# Patient Record
Sex: Male | Born: 1980 | Race: White | Hispanic: No | Marital: Single | Smoking: Former smoker
Health system: Southern US, Community
[De-identification: ages and names within clinical notes are randomized; demographics above are authoritative.]

## PROBLEM LIST (undated history)

## (undated) DIAGNOSIS — I10 Essential (primary) hypertension: Secondary | ICD-10-CM

## (undated) DIAGNOSIS — Z87898 Personal history of other specified conditions: Secondary | ICD-10-CM

## (undated) HISTORY — DX: Personal history of other specified conditions: Z87.898

## (undated) HISTORY — DX: Essential (primary) hypertension: I10

---

## 2013-04-30 DIAGNOSIS — Z302 Encounter for sterilization: Secondary | ICD-10-CM | POA: Insufficient documentation

## 2015-06-21 ENCOUNTER — Inpatient Hospital Stay: Payer: BLUE CROSS/BLUE SHIELD | Attending: Oncology | Admitting: Oncology

## 2015-06-21 ENCOUNTER — Encounter: Payer: Self-pay | Admitting: Oncology

## 2015-06-21 DIAGNOSIS — Z87891 Personal history of nicotine dependence: Secondary | ICD-10-CM | POA: Insufficient documentation

## 2015-06-21 DIAGNOSIS — R748 Abnormal levels of other serum enzymes: Secondary | ICD-10-CM | POA: Insufficient documentation

## 2015-06-21 DIAGNOSIS — Z79899 Other long term (current) drug therapy: Secondary | ICD-10-CM | POA: Insufficient documentation

## 2015-06-21 DIAGNOSIS — I10 Essential (primary) hypertension: Secondary | ICD-10-CM | POA: Insufficient documentation

## 2015-06-24 ENCOUNTER — Inpatient Hospital Stay: Payer: BLUE CROSS/BLUE SHIELD

## 2015-06-24 LAB — CBC WITH DIFFERENTIAL/PLATELET
Basophils Absolute: 0.1 10*3/uL (ref 0–0.1)
Basophils Relative: 1 %
EOS ABS: 0.1 10*3/uL (ref 0–0.7)
Eosinophils Relative: 1 %
HCT: 45 % (ref 40.0–52.0)
Hemoglobin: 15.8 g/dL (ref 13.0–18.0)
LYMPHS ABS: 2.6 10*3/uL (ref 1.0–3.6)
Lymphocytes Relative: 28 %
MCH: 32.7 pg (ref 26.0–34.0)
MCHC: 35 g/dL (ref 32.0–36.0)
MCV: 93.4 fL (ref 80.0–100.0)
MONO ABS: 0.6 10*3/uL (ref 0.2–1.0)
MONOS PCT: 6 %
Neutro Abs: 6 10*3/uL (ref 1.4–6.5)
Neutrophils Relative %: 64 %
Platelets: 251 10*3/uL (ref 150–440)
RBC: 4.82 MIL/uL (ref 4.40–5.90)
RDW: 12.3 % (ref 11.5–14.5)
WBC: 9.3 10*3/uL (ref 3.8–10.6)

## 2015-06-24 LAB — FERRITIN: Ferritin: 251 ng/mL (ref 24–336)

## 2015-06-24 LAB — IRON AND TIBC
IRON: 110 ug/dL (ref 45–182)
Saturation Ratios: 27 % (ref 17.9–39.5)
TIBC: 412 ug/dL (ref 250–450)
UIBC: 302 ug/dL

## 2015-07-03 NOTE — Progress Notes (Signed)
[facility]  Telephone:(336) [phone] Fax:(336) [phone]  ID: Lum Babe OB: 1981/06/14  MR#: [id number]  AOZ#:[id number]  Patient Care Team: Jerl Mina, MD as PCP - General (Family Medicine)  CHIEF COMPLAINT:  Chief Complaint  Patient presents with  . Chemotherapy    Hemochromatosis    INTERVAL HISTORY: Patient is a 34 year old male who was recently found to have homozygous for hemochromatosis. He also has mildly increased iron stores as well as liver enzymes. He currently feels well and is asymptomatic. He has no neurologic complaints. He denies any recent fevers or illnesses. He has a good appetite and denies weight loss. He denies any chest pain or shortness of breath. He has no abdominal pain and denies any nausea, vomiting, constipation, or diarrhea. He has no urinary complaints. Patient feels at his baseline and offers no specific complaints today.  REVIEW OF SYSTEMS:   Review of Systems  Constitutional: Negative.   Gastrointestinal: Negative.   Musculoskeletal: Negative.     As per HPI. Otherwise, a complete review of systems is negatve.  PAST MEDICAL HISTORY: Past Medical History  Diagnosis Date  . Hypertension   . History of seizure     PAST SURGICAL HISTORY: No past surgical history on file.  FAMILY HISTORY No family history on file.     ADVANCED DIRECTIVES:    HEALTH MAINTENANCE: History  Substance Use Topics  . Smoking status: Former Games developer  . Smokeless tobacco: Not on file  . Alcohol Use: Yes     Comment: occasional     Colonoscopy:  PAP:  Bone density:  Lipid panel:  No Known Allergies  Current Outpatient Prescriptions  Medication Sig Dispense Refill  . lisinopril (PRINIVIL,ZESTRIL) 10 MG tablet   10   No current facility-administered medications for this visit.    OBJECTIVE: Filed Vitals:   06/21/15 1518  BP: 138/92  Pulse: 71  Temp: 97.1 F (36.2 C)  Resp: 16     There is no height on file to  calculate BMI.    ECOG FS:0 - Asymptomatic  General: Well-developed, well-nourished, no acute distress. Eyes: Pink conjunctiva, anicteric sclera. HEENT: Normocephalic, moist mucous membranes, clear oropharnyx. Lungs: Clear to auscultation bilaterally. Heart: Regular rate and rhythm. No rubs, murmurs, or gallops. Abdomen: Soft, nontender, nondistended. No organomegaly noted, normoactive bowel sounds. Musculoskeletal: No edema, cyanosis, or clubbing. Neuro: Alert, answering all questions appropriately. Cranial nerves grossly intact. Skin: No rashes or petechiae noted. Psych: Normal affect. Lymphatics: No cervical, calvicular, axillary or inguinal LAD.   LAB RESULTS:  No results found for: NA, K, CL, CO2, GLUCOSE, BUN, CREATININE, CALCIUM, PROT, ALBUMIN, AST, ALT, ALKPHOS, BILITOT, GFRNONAA, GFRAA  Lab Results  Component Value Date   WBC 9.3 06/24/2015   NEUTROABS 6.0 06/24/2015   HGB 15.8 06/24/2015   HCT 45.0 06/24/2015   MCV 93.4 06/24/2015   PLT 251 06/24/2015     STUDIES: No results found.  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  PLAN:    1. Hereditary hemachromatosis: Patient iron stores are within normal limits, but he was still benefit from phlebotomy with a goal ferritin of approximately 50-100. Currently, his ferritin is 251. Return to clinic once a month with laboratory work and consideration of phlebotomy. Patient will then return to clinic in 4 months for further evaluation. 2. Elevated liver enzymes: Unclear etiology. Possibly but unlikely related to hemochromatosis, phlebotomy as above. Monitor.  Patient expressed understanding and was in agreement with this plan.  He also understands that He can call clinic at any time with any questions, concerns, or complaints.    Jeralyn Ruths, MD   07/03/2015 4:29 PM

## 2015-07-22 ENCOUNTER — Inpatient Hospital Stay: Payer: BLUE CROSS/BLUE SHIELD

## 2015-07-22 ENCOUNTER — Inpatient Hospital Stay: Payer: BLUE CROSS/BLUE SHIELD | Attending: Oncology

## 2015-07-22 DIAGNOSIS — Z79899 Other long term (current) drug therapy: Secondary | ICD-10-CM | POA: Insufficient documentation

## 2015-07-28 ENCOUNTER — Inpatient Hospital Stay: Payer: BLUE CROSS/BLUE SHIELD

## 2015-07-28 DIAGNOSIS — Z79899 Other long term (current) drug therapy: Secondary | ICD-10-CM | POA: Diagnosis not present

## 2015-07-28 LAB — CBC WITH DIFFERENTIAL/PLATELET
BASOS ABS: 0 10*3/uL (ref 0–0.1)
BASOS PCT: 0 %
EOS ABS: 0.1 10*3/uL (ref 0–0.7)
EOS PCT: 1 %
HCT: 42.7 % (ref 40.0–52.0)
Hemoglobin: 15.1 g/dL (ref 13.0–18.0)
LYMPHS ABS: 2.5 10*3/uL (ref 1.0–3.6)
Lymphocytes Relative: 27 %
MCH: 32.7 pg (ref 26.0–34.0)
MCHC: 35.4 g/dL (ref 32.0–36.0)
MCV: 92.6 fL (ref 80.0–100.0)
Monocytes Absolute: 0.7 10*3/uL (ref 0.2–1.0)
Monocytes Relative: 8 %
NEUTROS PCT: 64 %
Neutro Abs: 6 10*3/uL (ref 1.4–6.5)
PLATELETS: 228 10*3/uL (ref 150–440)
RBC: 4.61 MIL/uL (ref 4.40–5.90)
RDW: 12.4 % (ref 11.5–14.5)
WBC: 9.3 10*3/uL (ref 3.8–10.6)

## 2015-07-28 LAB — FERRITIN: FERRITIN: 145 ng/mL (ref 24–336)

## 2015-08-19 ENCOUNTER — Inpatient Hospital Stay: Payer: BLUE CROSS/BLUE SHIELD | Attending: Oncology

## 2015-08-19 ENCOUNTER — Inpatient Hospital Stay: Payer: BLUE CROSS/BLUE SHIELD

## 2015-08-19 LAB — CBC WITH DIFFERENTIAL/PLATELET
BASOS PCT: 1 %
Basophils Absolute: 0.1 10*3/uL (ref 0–0.1)
EOS PCT: 1 %
Eosinophils Absolute: 0.1 10*3/uL (ref 0–0.7)
HEMATOCRIT: 43.3 % (ref 40.0–52.0)
Hemoglobin: 15.1 g/dL (ref 13.0–18.0)
LYMPHS PCT: 30 %
Lymphs Abs: 2.3 10*3/uL (ref 1.0–3.6)
MCH: 32.3 pg (ref 26.0–34.0)
MCHC: 34.8 g/dL (ref 32.0–36.0)
MCV: 92.8 fL (ref 80.0–100.0)
MONO ABS: 0.5 10*3/uL (ref 0.2–1.0)
MONOS PCT: 6 %
NEUTROS ABS: 4.7 10*3/uL (ref 1.4–6.5)
Neutrophils Relative %: 62 %
PLATELETS: 243 10*3/uL (ref 150–440)
RBC: 4.67 MIL/uL (ref 4.40–5.90)
RDW: 12.3 % (ref 11.5–14.5)
WBC: 7.6 10*3/uL (ref 3.8–10.6)

## 2015-08-19 LAB — FERRITIN: Ferritin: 114 ng/mL (ref 24–336)

## 2015-08-23 ENCOUNTER — Encounter (INDEPENDENT_AMBULATORY_CARE_PROVIDER_SITE_OTHER): Payer: Self-pay

## 2015-08-23 ENCOUNTER — Inpatient Hospital Stay: Payer: BLUE CROSS/BLUE SHIELD

## 2015-09-16 ENCOUNTER — Inpatient Hospital Stay: Payer: BLUE CROSS/BLUE SHIELD | Attending: Oncology

## 2015-09-16 ENCOUNTER — Inpatient Hospital Stay: Payer: BLUE CROSS/BLUE SHIELD

## 2015-09-16 LAB — CBC WITH DIFFERENTIAL/PLATELET
BASOS ABS: 0 10*3/uL (ref 0–0.1)
BASOS PCT: 1 %
Eosinophils Absolute: 0.1 10*3/uL (ref 0–0.7)
Eosinophils Relative: 1 %
HEMATOCRIT: 44.7 % (ref 40.0–52.0)
HEMOGLOBIN: 15.7 g/dL (ref 13.0–18.0)
LYMPHS PCT: 34 %
Lymphs Abs: 2.5 10*3/uL (ref 1.0–3.6)
MCH: 32.5 pg (ref 26.0–34.0)
MCHC: 35 g/dL (ref 32.0–36.0)
MCV: 92.7 fL (ref 80.0–100.0)
MONO ABS: 0.5 10*3/uL (ref 0.2–1.0)
MONOS PCT: 6 %
NEUTROS ABS: 4.2 10*3/uL (ref 1.4–6.5)
NEUTROS PCT: 58 %
Platelets: 232 10*3/uL (ref 150–440)
RBC: 4.82 MIL/uL (ref 4.40–5.90)
RDW: 12.2 % (ref 11.5–14.5)
WBC: 7.3 10*3/uL (ref 3.8–10.6)

## 2015-09-16 LAB — FERRITIN: Ferritin: 89 ng/mL (ref 24–336)

## 2015-09-17 ENCOUNTER — Telehealth: Payer: Self-pay | Admitting: *Deleted

## 2015-09-17 NOTE — Telephone Encounter (Signed)
Call placed to patient to give lab results, ferritin 89. Patient does not need phlebotomy, patient to follow up in 1 month at previously scheduled appointment.

## 2015-09-24 ENCOUNTER — Encounter: Payer: Self-pay | Admitting: Oncology

## 2015-10-14 ENCOUNTER — Inpatient Hospital Stay: Payer: BLUE CROSS/BLUE SHIELD

## 2015-10-14 ENCOUNTER — Inpatient Hospital Stay: Payer: BLUE CROSS/BLUE SHIELD | Attending: Oncology | Admitting: Oncology

## 2015-10-14 DIAGNOSIS — R748 Abnormal levels of other serum enzymes: Secondary | ICD-10-CM | POA: Diagnosis not present

## 2015-10-14 DIAGNOSIS — I10 Essential (primary) hypertension: Secondary | ICD-10-CM | POA: Diagnosis not present

## 2015-10-14 DIAGNOSIS — Z79899 Other long term (current) drug therapy: Secondary | ICD-10-CM | POA: Diagnosis not present

## 2015-10-14 DIAGNOSIS — Z87891 Personal history of nicotine dependence: Secondary | ICD-10-CM | POA: Diagnosis not present

## 2015-10-14 LAB — CBC WITH DIFFERENTIAL/PLATELET
BASOS PCT: 1 %
Basophils Absolute: 0 10*3/uL (ref 0–0.1)
EOS PCT: 1 %
Eosinophils Absolute: 0.1 10*3/uL (ref 0–0.7)
HEMATOCRIT: 46.1 % (ref 40.0–52.0)
Hemoglobin: 16 g/dL (ref 13.0–18.0)
LYMPHS PCT: 33 %
Lymphs Abs: 2.5 10*3/uL (ref 1.0–3.6)
MCH: 31.6 pg (ref 26.0–34.0)
MCHC: 34.7 g/dL (ref 32.0–36.0)
MCV: 91.2 fL (ref 80.0–100.0)
MONO ABS: 0.4 10*3/uL (ref 0.2–1.0)
MONOS PCT: 6 %
NEUTROS ABS: 4.6 10*3/uL (ref 1.4–6.5)
Neutrophils Relative %: 59 %
PLATELETS: 244 10*3/uL (ref 150–440)
RBC: 5.05 MIL/uL (ref 4.40–5.90)
RDW: 12 % (ref 11.5–14.5)
WBC: 7.7 10*3/uL (ref 3.8–10.6)

## 2015-10-14 LAB — FERRITIN: Ferritin: 69 ng/mL (ref 24–336)

## 2015-10-31 NOTE — Progress Notes (Signed)
[facility]  Telephone:(336) [phone] Fax:(336) [phone]  ID: Francis Morrison OB: 04-26-1981  MR#: [id number]  GEX#:[id number]#:[id number]  Patient Care Team: Jerl MinaJames Hedrick, MD as PCP - General (Family Medicine)  CHIEF COMPLAINT: Homozygous for hemachromatosis. No chief complaint on file.   INTERVAL HISTORY: Patient returns to clinic today for repeat laboratory work, further evaluation, and continuation of phlebotomy. He continues to feel well and is asymptomatic. He has no neurologic complaints. He denies any recent fevers or illnesses. He has a good appetite and denies weight loss. He denies any chest pain or shortness of breath. He has no abdominal pain and denies any nausea, vomiting, constipation, or diarrhea. He has no urinary complaints. Patient offers no specific complaints today.  REVIEW OF SYSTEMS:   Review of Systems  Constitutional: Negative.   Respiratory: Negative.   Cardiovascular: Negative.   Gastrointestinal: Negative.   Musculoskeletal: Negative.   Neurological: Negative.     As per HPI. Otherwise, a complete review of systems is negatve.  PAST MEDICAL HISTORY: Past Medical History  Diagnosis Date  . Hypertension   . History of seizure     PAST SURGICAL HISTORY: No past surgical history on file.  FAMILY HISTORY: Reviewed and unchanged. No reported history of malignancy or chronic disease. No known history of hemochromatosis.     ADVANCED DIRECTIVES:    HEALTH MAINTENANCE: Social History  Substance Use Topics  . Smoking status: Former Games developermoker  . Smokeless tobacco: Not on file  . Alcohol Use: Yes     Comment: occasional     Colonoscopy:  PAP:  Bone density:  Lipid panel:  No Known Allergies  Current Outpatient Prescriptions  Medication Sig Dispense Refill  . lisinopril (PRINIVIL,ZESTRIL) 10 MG tablet   10   No current facility-administered medications for this visit.    OBJECTIVE: Filed Vitals:   10/14/15 1443  BP: 132/79    Pulse: 89  Temp: 98.6 F (37 C)  Resp: 16     There is no height on file to calculate BMI.    ECOG FS:0 - Asymptomatic  General: Well-developed, well-nourished, no acute distress. Eyes: Pink conjunctiva, anicteric sclera. Lungs: Clear to auscultation bilaterally. Heart: Regular rate and rhythm. No rubs, murmurs, or gallops. Abdomen: Soft, nontender, nondistended. No organomegaly noted, normoactive bowel sounds. Musculoskeletal: No edema, cyanosis, or clubbing. Neuro: Alert, answering all questions appropriately. Cranial nerves grossly intact. Skin: No rashes or petechiae noted. Psych: Normal affect.   LAB RESULTS:  No results found for: NA, K, CL, CO2, GLUCOSE, BUN, CREATININE, CALCIUM, PROT, ALBUMIN, AST, ALT, ALKPHOS, BILITOT, GFRNONAA, GFRAA  Lab Results  Component Value Date   WBC 7.7 10/14/2015   NEUTROABS 4.6 10/14/2015   HGB 16.0 10/14/2015   HCT 46.1 10/14/2015   MCV 91.2 10/14/2015   PLT 244 10/14/2015     STUDIES: No results found.  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  PLAN:    1. Hereditary hemachromatosis: Patient iron stores are within normal limits, but he will still benefit from phlebotomy with a goal ferritin of approximately 50-100. Currently, his ferritin is 69. Return to clinic every 6 weeks with laboratory work and consideration of phlebotomy. Patient will then return to clinic in 6 months for further evaluation. 2. Elevated liver enzymes: Unclear etiology. Possibly but unlikely related to hemochromatosis, phlebotomy as above. Monitor.  Patient expressed understanding and was in agreement with this plan. He also understands that He can call clinic at any time with  any questions, concerns, or complaints.    Jeralyn Ruths, MD   10/31/2015 10:45 PM

## 2015-11-25 ENCOUNTER — Inpatient Hospital Stay: Payer: BLUE CROSS/BLUE SHIELD

## 2015-11-25 ENCOUNTER — Inpatient Hospital Stay: Payer: BLUE CROSS/BLUE SHIELD | Attending: Oncology

## 2015-11-25 DIAGNOSIS — Z87891 Personal history of nicotine dependence: Secondary | ICD-10-CM | POA: Insufficient documentation

## 2015-11-25 DIAGNOSIS — R748 Abnormal levels of other serum enzymes: Secondary | ICD-10-CM | POA: Insufficient documentation

## 2015-11-25 DIAGNOSIS — Z79899 Other long term (current) drug therapy: Secondary | ICD-10-CM | POA: Insufficient documentation

## 2015-11-25 LAB — CBC WITH DIFFERENTIAL/PLATELET
Basophils Absolute: 0.1 10*3/uL (ref 0–0.1)
Basophils Relative: 1 %
EOS PCT: 1 %
Eosinophils Absolute: 0.1 10*3/uL (ref 0–0.7)
HEMATOCRIT: 45.3 % (ref 40.0–52.0)
Hemoglobin: 15.7 g/dL (ref 13.0–18.0)
LYMPHS ABS: 3 10*3/uL (ref 1.0–3.6)
LYMPHS PCT: 30 %
MCH: 31.3 pg (ref 26.0–34.0)
MCHC: 34.6 g/dL (ref 32.0–36.0)
MCV: 90.6 fL (ref 80.0–100.0)
MONO ABS: 0.6 10*3/uL (ref 0.2–1.0)
MONOS PCT: 7 %
NEUTROS ABS: 6 10*3/uL (ref 1.4–6.5)
Neutrophils Relative %: 61 %
PLATELETS: 254 10*3/uL (ref 150–440)
RBC: 5 MIL/uL (ref 4.40–5.90)
RDW: 12.5 % (ref 11.5–14.5)
WBC: 9.9 10*3/uL (ref 3.8–10.6)

## 2015-11-25 LAB — FERRITIN: Ferritin: 90 ng/mL (ref 24–336)

## 2016-01-06 ENCOUNTER — Inpatient Hospital Stay: Payer: BLUE CROSS/BLUE SHIELD | Attending: Oncology

## 2016-01-06 ENCOUNTER — Inpatient Hospital Stay: Payer: BLUE CROSS/BLUE SHIELD

## 2016-01-06 LAB — CBC WITH DIFFERENTIAL/PLATELET
BASOS ABS: 0.1 10*3/uL (ref 0–0.1)
Basophils Relative: 1 %
EOS PCT: 1 %
Eosinophils Absolute: 0.1 10*3/uL (ref 0–0.7)
HEMATOCRIT: 48 % (ref 40.0–52.0)
Hemoglobin: 16.8 g/dL (ref 13.0–18.0)
LYMPHS ABS: 3 10*3/uL (ref 1.0–3.6)
LYMPHS PCT: 33 %
MCH: 32.4 pg (ref 26.0–34.0)
MCHC: 35.1 g/dL (ref 32.0–36.0)
MCV: 92.2 fL (ref 80.0–100.0)
MONO ABS: 0.6 10*3/uL (ref 0.2–1.0)
MONOS PCT: 6 %
NEUTROS ABS: 5.4 10*3/uL (ref 1.4–6.5)
Neutrophils Relative %: 59 %
PLATELETS: 210 10*3/uL (ref 150–440)
RBC: 5.2 MIL/uL (ref 4.40–5.90)
RDW: 13.4 % (ref 11.5–14.5)
WBC: 9.1 10*3/uL (ref 3.8–10.6)

## 2016-01-06 LAB — FERRITIN: Ferritin: 82 ng/mL (ref 24–336)

## 2016-02-17 ENCOUNTER — Inpatient Hospital Stay: Payer: BLUE CROSS/BLUE SHIELD | Attending: Oncology

## 2016-02-17 ENCOUNTER — Inpatient Hospital Stay: Payer: BLUE CROSS/BLUE SHIELD

## 2016-02-17 LAB — CBC WITH DIFFERENTIAL/PLATELET
BASOS ABS: 0 10*3/uL (ref 0–0.1)
EOS ABS: 0.1 10*3/uL (ref 0–0.7)
HCT: 45.2 % (ref 40.0–52.0)
HEMOGLOBIN: 16.3 g/dL (ref 13.0–18.0)
Lymphocytes Relative: 37 %
Lymphs Abs: 2.5 10*3/uL (ref 1.0–3.6)
MCH: 33.3 pg (ref 26.0–34.0)
MCHC: 36.1 g/dL — ABNORMAL HIGH (ref 32.0–36.0)
MCV: 92.3 fL (ref 80.0–100.0)
Monocytes Absolute: 0.5 10*3/uL (ref 0.2–1.0)
Monocytes Relative: 7 %
Neutro Abs: 3.7 10*3/uL (ref 1.4–6.5)
PLATELETS: 204 10*3/uL (ref 150–440)
RBC: 4.89 MIL/uL (ref 4.40–5.90)
RDW: 12.5 % (ref 11.5–14.5)
WBC: 6.9 10*3/uL (ref 3.8–10.6)

## 2016-02-17 LAB — FERRITIN: FERRITIN: 81 ng/mL (ref 24–336)

## 2016-03-30 ENCOUNTER — Inpatient Hospital Stay: Payer: BLUE CROSS/BLUE SHIELD

## 2016-03-30 ENCOUNTER — Inpatient Hospital Stay: Payer: BLUE CROSS/BLUE SHIELD | Attending: Oncology

## 2016-03-30 LAB — CBC WITH DIFFERENTIAL/PLATELET
BASOS ABS: 0 10*3/uL (ref 0–0.1)
EOS ABS: 0 10*3/uL (ref 0–0.7)
HCT: 43.8 % (ref 40.0–52.0)
Hemoglobin: 15.7 g/dL (ref 13.0–18.0)
Lymphs Abs: 2.1 10*3/uL (ref 1.0–3.6)
MCH: 32.8 pg (ref 26.0–34.0)
MCHC: 35.8 g/dL (ref 32.0–36.0)
MCV: 91.6 fL (ref 80.0–100.0)
MONO ABS: 0.6 10*3/uL (ref 0.2–1.0)
Monocytes Relative: 7 %
Neutro Abs: 6.5 10*3/uL (ref 1.4–6.5)
Neutrophils Relative %: 71 %
PLATELETS: 246 10*3/uL (ref 150–440)
RBC: 4.79 MIL/uL (ref 4.40–5.90)
RDW: 12.5 % (ref 11.5–14.5)
WBC: 9.2 10*3/uL (ref 3.8–10.6)

## 2016-03-30 LAB — FERRITIN: Ferritin: 119 ng/mL (ref 24–336)

## 2016-04-20 ENCOUNTER — Other Ambulatory Visit: Payer: BLUE CROSS/BLUE SHIELD

## 2016-04-20 ENCOUNTER — Ambulatory Visit: Payer: BLUE CROSS/BLUE SHIELD | Admitting: Oncology

## 2016-04-27 ENCOUNTER — Inpatient Hospital Stay: Payer: BLUE CROSS/BLUE SHIELD | Admitting: Oncology

## 2016-04-27 ENCOUNTER — Inpatient Hospital Stay: Payer: BLUE CROSS/BLUE SHIELD

## 2016-05-15 ENCOUNTER — Inpatient Hospital Stay (HOSPITAL_BASED_OUTPATIENT_CLINIC_OR_DEPARTMENT_OTHER): Payer: BLUE CROSS/BLUE SHIELD | Admitting: Oncology

## 2016-05-15 ENCOUNTER — Inpatient Hospital Stay: Payer: BLUE CROSS/BLUE SHIELD | Attending: Oncology

## 2016-05-15 ENCOUNTER — Inpatient Hospital Stay: Payer: BLUE CROSS/BLUE SHIELD

## 2016-05-15 DIAGNOSIS — Z79899 Other long term (current) drug therapy: Secondary | ICD-10-CM | POA: Insufficient documentation

## 2016-05-15 DIAGNOSIS — I10 Essential (primary) hypertension: Secondary | ICD-10-CM | POA: Insufficient documentation

## 2016-05-15 DIAGNOSIS — Z87891 Personal history of nicotine dependence: Secondary | ICD-10-CM | POA: Diagnosis not present

## 2016-05-15 LAB — CBC WITH DIFFERENTIAL/PLATELET
Basophils Absolute: 0 10*3/uL (ref 0–0.1)
Basophils Relative: 1 %
EOS ABS: 0 10*3/uL (ref 0–0.7)
Eosinophils Relative: 1 %
HEMATOCRIT: 44.3 % (ref 40.0–52.0)
HEMOGLOBIN: 15.7 g/dL (ref 13.0–18.0)
LYMPHS ABS: 1.9 10*3/uL (ref 1.0–3.6)
LYMPHS PCT: 24 %
MCH: 33 pg (ref 26.0–34.0)
MCHC: 35.5 g/dL (ref 32.0–36.0)
MCV: 93 fL (ref 80.0–100.0)
MONOS PCT: 7 %
Monocytes Absolute: 0.6 10*3/uL (ref 0.2–1.0)
NEUTROS ABS: 5.3 10*3/uL (ref 1.4–6.5)
NEUTROS PCT: 67 %
Platelets: 186 10*3/uL (ref 150–440)
RBC: 4.76 MIL/uL (ref 4.40–5.90)
RDW: 12.5 % (ref 11.5–14.5)
WBC: 7.8 10*3/uL (ref 3.8–10.6)

## 2016-05-15 LAB — FERRITIN: FERRITIN: 64 ng/mL (ref 24–336)

## 2016-05-21 NOTE — Progress Notes (Signed)
[facility]  Telephone:(336) [phone] Fax:(336) [phone]  ID: Francis BabePatrick J Morrison OB: 1981-10-23  MR#: [id number]  AOZ#:[id number]#:[id number]  Patient Care Team: Francis MinaJames Hedrick, MD as PCP - General (Family Medicine)  CHIEF COMPLAINT: Homozygous for hemachromatosis. Chief Complaint  Patient presents with  . Follow-up    hemochromotosis    INTERVAL HISTORY: Patient returns to clinic today for repeat laboratory work, further evaluation, and continuation of phlebotomy. He continues to feel well and is asymptomatic. He has no neurologic complaints. He denies any recent fevers or illnesses. He has a good appetite and denies weight loss. He denies any chest pain or shortness of breath. He has no abdominal pain and denies any nausea, vomiting, constipation, or diarrhea. He has no urinary complaints. Patient offers no specific complaints today.  REVIEW OF SYSTEMS:   Review of Systems  Constitutional: Negative.  Negative for fever, weight loss and malaise/fatigue.  Respiratory: Negative.  Negative for cough and shortness of breath.   Cardiovascular: Negative.  Negative for chest pain.  Gastrointestinal: Negative.  Negative for blood in stool and melena.  Genitourinary: Negative.   Musculoskeletal: Negative.   Neurological: Negative.  Negative for weakness.    As per HPI. Otherwise, a complete review of systems is negatve.  PAST MEDICAL HISTORY: Past Medical History  Diagnosis Date  . Hypertension   . History of seizure     PAST SURGICAL HISTORY: No past surgical history on file.  FAMILY HISTORY: Reviewed and unchanged. No reported history of malignancy or chronic disease. No known history of hemochromatosis.     ADVANCED DIRECTIVES:    HEALTH MAINTENANCE: Social History  Substance Use Topics  . Smoking status: Former Games developermoker  . Smokeless tobacco: Not on file  . Alcohol Use: Yes     Comment: occasional     Colonoscopy:  PAP:  Bone density:  Lipid panel:  No Known  Allergies  Current Outpatient Prescriptions  Medication Sig Dispense Refill  . ALPRAZolam (XANAX) 0.25 MG tablet   0  . clomiPHENE (CLOMID) 50 MG tablet   4  . escitalopram (LEXAPRO) 10 MG tablet Take by mouth.    Marland Kitchen. lisinopril (PRINIVIL,ZESTRIL) 10 MG tablet   10   No current facility-administered medications for this visit.    OBJECTIVE: Filed Vitals:   05/15/16 1018  BP: 126/88  Pulse: 72  Temp: 97.9 F (36.6 C)  Resp: 18     There is no height on file to calculate BMI.    ECOG FS:0 - Asymptomatic  General: Well-developed, well-nourished, no acute distress. Eyes: Pink conjunctiva, anicteric sclera. Lungs: Clear to auscultation bilaterally. Heart: Regular rate and rhythm. No rubs, murmurs, or gallops. Abdomen: Soft, nontender, nondistended. No organomegaly noted, normoactive bowel sounds. Musculoskeletal: No edema, cyanosis, or clubbing. Neuro: Alert, answering all questions appropriately. Cranial nerves grossly intact. Skin: No rashes or petechiae noted. Psych: Normal affect.   LAB RESULTS:  No results found for: NA, K, CL, CO2, GLUCOSE, BUN, CREATININE, CALCIUM, PROT, ALBUMIN, AST, ALT, ALKPHOS, BILITOT, GFRNONAA, GFRAA  Lab Results  Component Value Date   WBC 7.8 05/15/2016   NEUTROABS 5.3 05/15/2016   HGB 15.7 05/15/2016   HCT 44.3 05/15/2016   MCV 93.0 05/15/2016   PLT 186 05/15/2016   Lab Results  Component Value Date   FERRITIN 64 05/15/2016     STUDIES: No results found.  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  PLAN:    1. Hereditary hemachromatosis: Although patient's ferritin is  at goal of 64, his hemoglobin slightly increased at 15.7.  After discussion with the patient, he wishes to proceed with 400 mL phlebotomy today in order to maintain his ferritin between 50 and 100. Patient does not appear to need frequent phlebotomies, therefore will return to clinic in 6 months for laboratory  work, further evaluation, and consideration of additional phlebotomy.  2. History of elevated liver enzymes: Unclear etiology. Possibly but unlikely related to hemochromatosis, phlebotomy as above. Monitor.  Patient expressed understanding and was in agreement with this plan. He also understands that He can call clinic at any time with any questions, concerns, or complaints.    Jeralyn Ruths, MD   05/21/2016 11:01 PM

## 2016-10-20 ENCOUNTER — Emergency Department
Admission: EM | Admit: 2016-10-20 | Discharge: 2016-10-20 | Disposition: A | Discharge status: Home / Self Care | Payer: BLUE CROSS/BLUE SHIELD | Attending: Student in an Organized Health Care Education/Training Program | Admitting: Student in an Organized Health Care Education/Training Program

## 2016-10-20 ENCOUNTER — Encounter: Payer: Self-pay | Admitting: *Deleted

## 2016-10-20 ENCOUNTER — Emergency Department: Payer: BLUE CROSS/BLUE SHIELD

## 2016-10-20 DIAGNOSIS — Y999 Unspecified external cause status: Secondary | ICD-10-CM | POA: Diagnosis not present

## 2016-10-20 DIAGNOSIS — Y9389 Activity, other specified: Secondary | ICD-10-CM | POA: Diagnosis not present

## 2016-10-20 DIAGNOSIS — Z87891 Personal history of nicotine dependence: Secondary | ICD-10-CM | POA: Diagnosis not present

## 2016-10-20 DIAGNOSIS — Z79899 Other long term (current) drug therapy: Secondary | ICD-10-CM | POA: Insufficient documentation

## 2016-10-20 DIAGNOSIS — I10 Essential (primary) hypertension: Secondary | ICD-10-CM | POA: Insufficient documentation

## 2016-10-20 DIAGNOSIS — Z23 Encounter for immunization: Secondary | ICD-10-CM | POA: Diagnosis not present

## 2016-10-20 DIAGNOSIS — S60351A Superficial foreign body of right thumb, initial encounter: Secondary | ICD-10-CM | POA: Diagnosis not present

## 2016-10-20 DIAGNOSIS — Y929 Unspecified place or not applicable: Secondary | ICD-10-CM | POA: Insufficient documentation

## 2016-10-20 DIAGNOSIS — S6991XA Unspecified injury of right wrist, hand and finger(s), initial encounter: Secondary | ICD-10-CM

## 2016-10-20 DIAGNOSIS — W450XXA Nail entering through skin, initial encounter: Secondary | ICD-10-CM | POA: Diagnosis not present

## 2016-10-20 DIAGNOSIS — S66504A Unspecified injury of intrinsic muscle, fascia and tendon of right ring finger at wrist and hand level, initial encounter: Secondary | ICD-10-CM | POA: Diagnosis present

## 2016-10-20 MED ORDER — CEPHALEXIN 500 MG PO CAPS: 1000.0000 mg | ORAL_CAPSULE | Freq: Two times a day (BID) | ORAL | 0 refills | Status: AC

## 2016-10-20 MED ORDER — OXYCODONE-ACETAMINOPHEN 7.5-325 MG PO TABS: 1.0000 | ORAL_TABLET | ORAL | 0 refills | Status: AC | PRN

## 2016-10-20 MED ORDER — LIDOCAINE HCL (PF) 1 % IJ SOLN
INTRAMUSCULAR | Status: AC
  Administered 2016-10-20: 23:00:00
  Filled 2016-10-20: qty 5

## 2016-10-20 MED ORDER — OXYCODONE-ACETAMINOPHEN 5-325 MG PO TABS
1.0000 | ORAL_TABLET | Freq: Once | ORAL | Status: AC
  Administered 2016-10-20: 1 via ORAL
  Filled 2016-10-20: qty 1

## 2016-10-20 MED ORDER — SULFAMETHOXAZOLE-TRIMETHOPRIM 800-160 MG PO TABS: 1.0000 | ORAL_TABLET | Freq: Two times a day (BID) | ORAL | 0 refills | Status: AC

## 2016-10-20 MED ORDER — TETANUS-DIPHTH-ACELL PERTUSSIS 5-2.5-18.5 LF-MCG/0.5 IM SUSP
INTRAMUSCULAR | Status: AC
  Administered 2016-10-20: 0.5 mL via INTRAMUSCULAR
  Filled 2016-10-20: qty 0.5

## 2016-10-20 MED ORDER — TETANUS-DIPHTH-ACELL PERTUSSIS 5-2.5-18.5 LF-MCG/0.5 IM SUSP
0.5000 mL | Freq: Once | INTRAMUSCULAR | Status: AC
  Administered 2016-10-20: 0.5 mL via INTRAMUSCULAR

## 2016-10-20 NOTE — ED Triage Notes (Addendum)
Pt has nail in right thumb.  States nail gun went off into right thumb.  Bleeding controlled.

## 2016-10-20 NOTE — ED Notes (Signed)
Pt with nail to right thumb, states it does not pass all the way through, tip of glove remains on finger.

## 2016-10-20 NOTE — ED Provider Notes (Signed)
[facility] Emergency Department Provider Note ____________________________________________  Time seen: Approximately 10:12 PM  I have reviewed the triage vital signs and the nursing notes.   HISTORY  Chief Complaint Foreign Body and Hand Injury    HPI Francis Morrison is a 35 y.o. male presents after a nail gun fired and had a nail go through glove into his thumb. Patient can move thumb normally. Patient has not removed glove. Patient denies pain. Patient has not taken anything for pain.   Past Medical History:  Diagnosis Date  . History of seizure   . Hypertension     Patient Active Problem List   Diagnosis Date Noted  . BP (high blood pressure) 05/15/2016  . Hemochromatosis 06/21/2015  . Encounter for sterilization 04/30/2013    No past surgical history on file.  Prior to Admission medications   Medication Sig Start Date End Date Taking? Authorizing Provider  ALPRAZolam Prudy Feeler(XANAX) 0.25 MG tablet  03/23/16   Historical Provider, MD  cephALEXin (KEFLEX) 500 MG capsule Take 2 capsules (1,000 mg total) by mouth 2 (two) times daily. 10/20/16 10/30/16  Enid DerryAshley Mickenzie Stolar, PA-C  clomiPHENE (CLOMID) 50 MG tablet  02/06/16   Historical Provider, MD  escitalopram (LEXAPRO) 10 MG tablet Take by mouth. 03/23/16 06/21/16  Historical Provider, MD  lisinopril (PRINIVIL,ZESTRIL) 10 MG tablet  06/01/15   Historical Provider, MD  oxyCODONE-acetaminophen (PERCOCET) 7.5-325 MG tablet Take 1 tablet by mouth every 4 (four) hours as needed for severe pain. 10/20/16 10/25/16  Enid DerryAshley Abdulrahim Siddiqi, PA-C  sulfamethoxazole-trimethoprim (BACTRIM DS,SEPTRA DS) 800-160 MG tablet Take 1 tablet by mouth 2 (two) times daily. 10/20/16 10/30/16  Enid DerryAshley Tor Tsuda, PA-C    Allergies Patient has no known allergies.  No family history on file.  Social History Social History  Substance Use Topics  . Smoking status: Former Games developermoker  . Smokeless tobacco: Never Used  . Alcohol use Yes     Comment:  occasional    Review of Systems Constitutional: No recent illness. No syncope. Cardiovascular: Denies chest pain or palpitations. Respiratory: Denies shortness of breath. Skin: Negative for rash.   ____________________________________________   PHYSICAL EXAM:  VITAL SIGNS: ED Triage Vitals [10/20/16 2117]  Enc Vitals Group     BP (!) 146/107     Pulse Rate 82     Resp 18     Temp 98.4 F (36.9 C)     Temp Source Oral     SpO2 99 %     Weight 196 lb (88.9 kg)     Height 6' (1.829 m)     Head Circumference      Peak Flow      Pain Score 2     Pain Loc      Pain Edu?      Excl. in GC?     Constitutional: Alert and oriented. Well appearing and in no acute distress. Eyes: Conjunctivae are normal.  Head: Atraumatic. Neck: No stridor.  Respiratory: Normal respiratory effort.   Musculoskeletal: Full ROM of thumb. 3 inch nail sticking 2.25 inches out of tip of thumb. Fingernail not compromised. Bleeding controlled.  Neurologic:  Normal speech and language. No gross focal neurologic deficits are appreciated. Speech is normal. No gait instability. Skin:  Skin is warm, dry. No bruising.  Psychiatric: Mood and affect are normal. Speech and behavior are normal.  ____________________________________________   LABS (all labs ordered are listed, but only abnormal results are displayed)  Labs Reviewed - No data to display ____________________________________________  RADIOLOGY  I, Enid Derryshley Unity Luepke, personally viewed and evaluated these images (plain radiographs) as part of my medical decision making, as well as reviewing the written report by the radiologist.  PROCEDURES  Procedure(s) performed:   Foreign Body Removal Performed by: Enid DerryAshley Saiya Crist  Consent: Verbal consent obtained.  Consent given by: patient  Prepped and Draped in normal sterile fashion  Wound explored: Nail in place  Location: Tip of thumb  Local anesthetic: lidocaine 1% w/o epinephrine, Digital  block applied  Anesthetic total: 3 ml  Irrigation method: syringe  Amount of cleaning: 500 ml normal saline  Technique: After digital block took effect, nail was removed. Minimal bleeding occurred. Wound was irrigated and dressing applied.  Patient tolerance: Patient tolerated the procedure well with no immediate complications.  ____________________________________________   INITIAL IMPRESSION / ASSESSMENT AND PLAN / ED COURSE  Clinical Course     Pertinent labs & imaging results that were available during my care of the patient were reviewed by me and considered in my medical decision making (see chart for details).  Xray was obtained to see how far into finger nail went. Nail was removed from finger without complications. Patient was instructed to return to the ED immediately for redness or swelling to the affected area or fever. Patient was instructed to take entire course of antibiotics. Patient was instructed to follow up with PCP next week for wound recheck. Tetanus shot was updated.  ____________________________________________   FINAL CLINICAL IMPRESSION(S) / ED DIAGNOSES  Final diagnoses:  Injury of finger of right hand, initial encounter      Enid Derryshley Raidon Swanner, PA-C 10/20/16 [id number]    Francis EddyPatrick Robinson, MD 10/20/16 [phone]

## 2016-10-20 NOTE — Discharge Instructions (Signed)
Return to ED immediately for fever or swelling and redness of area

## 2016-10-20 NOTE — ED Notes (Signed)
PA at bedside.

## 2016-11-12 NOTE — Progress Notes (Signed)
[facility]  Telephone:(336) [phone] Fax:([phone]  ID: Francis Morrison OB: 21-Sep-1981  MR#: [id number]  ZJI#:[id number]#:[id number]  Patient Care Team: Jerl MinaJames Hedrick, MD as PCP - General (Family Medicine)  CHIEF COMPLAINT: Homozygous for hereditary hemachromatosis.  INTERVAL HISTORY: Patient returns to clinic today for repeat laboratory work, further evaluation, and continuation of phlebotomy. He continues to feel well and is asymptomatic. He has no neurologic complaints. He denies any recent fevers or illnesses. He has a good appetite and denies weight loss. He denies any chest pain or shortness of breath. He has no abdominal pain and denies any nausea, vomiting, constipation, or diarrhea. He has no urinary complaints. Patient offers no specific complaints today.  REVIEW OF SYSTEMS:   Review of Systems  Constitutional: Negative.  Negative for fever, malaise/fatigue and weight loss.  Respiratory: Negative.  Negative for cough and shortness of breath.   Cardiovascular: Negative.  Negative for chest pain.  Gastrointestinal: Negative.  Negative for blood in stool and melena.  Genitourinary: Negative.   Musculoskeletal: Negative.   Neurological: Negative.  Negative for weakness.    As per HPI. Otherwise, a complete review of systems is negative.  PAST MEDICAL HISTORY: Past Medical History:  Diagnosis Date  . History of seizure   . Hypertension     PAST SURGICAL HISTORY: No past surgical history on file.  FAMILY HISTORY: Reviewed and unchanged. No reported history of malignancy or chronic disease. No known history of hemochromatosis.     ADVANCED DIRECTIVES:    HEALTH MAINTENANCE: Social History  Substance Use Topics  . Smoking status: Former Games developermoker  . Smokeless tobacco: Never Used  . Alcohol use Yes     Comment: occasional     Colonoscopy:  PAP:  Bone density:  Lipid panel:  No Known Allergies  Current Outpatient Prescriptions  Medication Sig  Dispense Refill  . cetirizine (ZYRTEC) 10 MG tablet Take 10 mg by mouth daily.    Marland Kitchen. lisinopril (PRINIVIL,ZESTRIL) 10 MG tablet Take 10 mg by mouth daily.   10   No current facility-administered medications for this visit.     OBJECTIVE: Vitals:   11/13/16 1341  BP: (!) 137/101  Pulse: (!) 121  Resp: 18  Temp: 98.4 F (36.9 C)     Body mass index is 26 kg/m.    ECOG FS:0 - Asymptomatic  General: Well-developed, well-nourished, no acute distress. Eyes: Pink conjunctiva, anicteric sclera. Lungs: Clear to auscultation bilaterally. Heart: Regular rate and rhythm. No rubs, murmurs, or gallops. Abdomen: Soft, nontender, nondistended. No organomegaly noted, normoactive bowel sounds. Musculoskeletal: No edema, cyanosis, or clubbing. Neuro: Alert, answering all questions appropriately. Cranial nerves grossly intact. Skin: No rashes or petechiae noted. Psych: Normal affect.   LAB RESULTS:  No results found for: NA, K, CL, CO2, GLUCOSE, BUN, CREATININE, CALCIUM, PROT, ALBUMIN, AST, ALT, ALKPHOS, BILITOT, GFRNONAA, GFRAA  Lab Results  Component Value Date   WBC 7.4 11/13/2016   NEUTROABS 4.9 11/13/2016   HGB 16.4 11/13/2016   HCT 46.4 11/13/2016   MCV 91.4 11/13/2016   PLT 210 11/13/2016   Lab Results  Component Value Date   FERRITIN 73 11/13/2016     STUDIES: Dg Hand Complete Right  Result Date: 10/20/2016 CLINICAL DATA:  Nail gun went off into patient's right thumb. Initial encounter. EXAM: RIGHT HAND - COMPLETE 3+ VIEW COMPARISON:  None. FINDINGS: A metallic nail appears to be embedded at the distal tuft of the first distal phalanx. Associated soft tissue swelling is noted. Visualized  joint spaces are preserved. The carpal rows appear grossly intact, and demonstrate normal alignment. IMPRESSION: Metallic nail appears to be embedded at the distal tuft of the first distal phalanx. Electronically Signed   By: Roanna RaiderJeffery  Chang M.D.   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  PLAN:    1. Hereditary hemachromatosis: Although patient's ferritin is at goal of 73, his hemoglobin slightly increased at 16.4.  After discussion with the patient, he wishes to proceed with 400 mL phlebotomy today in order to maintain his ferritin between 50 and 100. Patient has inquired about doing his phlebotomy at a blood donation center and the appropriate order form was filled out for him. No further follow-up is necessary. Patient has been instructed to continue phlebotomy 2-3 times a year to maintain his ferritin below 100. Please refer patient back if there are any questions or concerns.  2. History of elevated liver enzymes: Unclear etiology. Possibly but unlikely related to hemochromatosis, phlebotomy as above. Monitor.  Patient expressed understanding and was in agreement with this plan. He also understands that He can call clinic at any time with any questions, concerns, or complaints.    Jeralyn Ruthsimothy J Daijha Leggio, MD   11/15/2016 8:59 AM

## 2016-11-13 ENCOUNTER — Inpatient Hospital Stay: Payer: BLUE CROSS/BLUE SHIELD | Attending: Oncology | Admitting: Oncology

## 2016-11-13 ENCOUNTER — Inpatient Hospital Stay: Payer: BLUE CROSS/BLUE SHIELD

## 2016-11-13 ENCOUNTER — Other Ambulatory Visit: Payer: Self-pay

## 2016-11-13 DIAGNOSIS — Z79899 Other long term (current) drug therapy: Secondary | ICD-10-CM | POA: Insufficient documentation

## 2016-11-13 DIAGNOSIS — I10 Essential (primary) hypertension: Secondary | ICD-10-CM | POA: Diagnosis not present

## 2016-11-13 DIAGNOSIS — Z87891 Personal history of nicotine dependence: Secondary | ICD-10-CM | POA: Insufficient documentation

## 2016-11-13 LAB — CBC WITH DIFFERENTIAL/PLATELET
BASOS ABS: 0 10*3/uL (ref 0–0.1)
Basophils Relative: 1 %
Eosinophils Absolute: 0 10*3/uL (ref 0–0.7)
Eosinophils Relative: 0 %
HEMATOCRIT: 46.4 % (ref 40.0–52.0)
Hemoglobin: 16.4 g/dL (ref 13.0–18.0)
LYMPHS ABS: 2 10*3/uL (ref 1.0–3.6)
LYMPHS PCT: 27 %
MCH: 32.4 pg (ref 26.0–34.0)
MCHC: 35.4 g/dL (ref 32.0–36.0)
MCV: 91.4 fL (ref 80.0–100.0)
MONO ABS: 0.4 10*3/uL (ref 0.2–1.0)
MONOS PCT: 5 %
NEUTROS ABS: 4.9 10*3/uL (ref 1.4–6.5)
Neutrophils Relative %: 67 %
Platelets: 210 10*3/uL (ref 150–440)
RBC: 5.08 MIL/uL (ref 4.40–5.90)
RDW: 12.5 % (ref 11.5–14.5)
WBC: 7.4 10*3/uL (ref 3.8–10.6)

## 2016-11-13 LAB — FERRITIN: FERRITIN: 73 ng/mL (ref 24–336)

## 2016-11-13 NOTE — Progress Notes (Signed)
MD, Dr Orlie DakinFinnegan  states to proceed with phlebotomy today despite parameters which state to hold if ferritin less then 100. Ferritin today is 73. Hemoglobin is 16.4. Patient states he feels symptomatic and sluggish and told MD he would like to have phlebotomy today.

## 2016-11-13 NOTE — Patient Instructions (Signed)

## 2016-11-13 NOTE — Progress Notes (Signed)
Complains of feeling tired today. BP always elevated during MD appts per pt. Monitors BP at home which has been normal.

## 2017-01-30 ENCOUNTER — Other Ambulatory Visit: Payer: Self-pay

## 2017-02-12 ENCOUNTER — Other Ambulatory Visit: Payer: Self-pay

## 2017-02-12 DIAGNOSIS — I43 Cardiomyopathy in diseases classified elsewhere: Secondary | ICD-10-CM

## 2017-02-12 DIAGNOSIS — E785 Hyperlipidemia, unspecified: Secondary | ICD-10-CM

## 2017-02-12 DIAGNOSIS — E291 Testicular hypofunction: Secondary | ICD-10-CM

## 2017-02-12 DIAGNOSIS — M109 Gout, unspecified: Secondary | ICD-10-CM

## 2017-02-14 LAB — SPECIMEN STATUS

## 2017-02-15 LAB — COMPREHENSIVE METABOLIC PANEL
ALBUMIN: 4.8 g/dL (ref 3.5–5.5)
ALT: 22 IU/L (ref 0–44)
AST: 26 IU/L (ref 0–40)
Albumin/Globulin Ratio: 1.8 (ref 1.2–2.2)
Alkaline Phosphatase: 61 IU/L (ref 39–117)
BUN / CREAT RATIO: 17 (ref 9–20)
BUN: 17 mg/dL (ref 6–20)
Bilirubin Total: 0.7 mg/dL (ref 0.0–1.2)
CO2: 22 mmol/L (ref 18–29)
CREATININE: 0.99 mg/dL (ref 0.76–1.27)
Calcium: 9.6 mg/dL (ref 8.7–10.2)
Chloride: 102 mmol/L (ref 96–106)
GFR calc Af Amer: 112 mL/min/{1.73_m2} (ref >59)
GFR, EST NON AFRICAN AMERICAN: 97 mL/min/{1.73_m2} (ref >59)
GLOBULIN, TOTAL: 2.6 g/dL (ref 1.5–4.5)
Glucose: 116 mg/dL — ABNORMAL HIGH (ref 65–99)
Potassium: 4.7 mmol/L (ref 3.5–5.2)
SODIUM: 143 mmol/L (ref 134–144)
TOTAL PROTEIN: 7.4 g/dL (ref 6.0–8.5)

## 2017-02-15 LAB — IRON: Iron: 116 ug/dL (ref 38–169)

## 2017-02-15 LAB — URIC ACID: Uric Acid: 8.3 mg/dL (ref 3.7–8.6)

## 2017-02-15 LAB — FERRITIN: Ferritin: 75 ng/mL (ref 30–400)

## 2017-02-15 LAB — TESTOSTERONE: Testosterone: 618 ng/dL (ref 264–916)

## 2017-02-15 LAB — SPECIMEN STATUS REPORT

## 2017-08-28 ENCOUNTER — Other Ambulatory Visit: Payer: Self-pay

## 2017-08-28 DIAGNOSIS — I10 Essential (primary) hypertension: Secondary | ICD-10-CM

## 2017-08-28 DIAGNOSIS — Z23 Encounter for immunization: Secondary | ICD-10-CM

## 2017-08-28 DIAGNOSIS — E785 Hyperlipidemia, unspecified: Secondary | ICD-10-CM

## 2017-08-28 LAB — POCT URINALYSIS DIPSTICK
Bilirubin, UA: NEGATIVE
Blood, UA: NEGATIVE
Glucose, UA: NEGATIVE
Ketones, UA: NEGATIVE
LEUKOCYTES UA: NEGATIVE
Nitrite, UA: NEGATIVE
PROTEIN UA: NEGATIVE
SPEC GRAV UA: 1.02 (ref 1.010–1.025)
UROBILINOGEN UA: 0.2 U/dL
pH, UA: 6 (ref 5.0–8.0)

## 2017-08-28 NOTE — Addendum Note (Signed)
Addended by: Skipper Cliche on: 08/28/2017 08:49 AM   Modules accepted: Orders

## 2017-08-29 LAB — CBC WITH DIFFERENTIAL/PLATELET
BASOS ABS: 0 10*3/uL (ref 0.0–0.2)
Basos: 0 %
EOS (ABSOLUTE): 0.1 10*3/uL (ref 0.0–0.4)
Eos: 2 %
HEMOGLOBIN: 15.5 g/dL (ref 13.0–17.7)
Hematocrit: 43.9 % (ref 37.5–51.0)
Immature Grans (Abs): 0 10*3/uL (ref 0.0–0.1)
Immature Granulocytes: 0 %
LYMPHS ABS: 2.7 10*3/uL (ref 0.7–3.1)
Lymphs: 40 %
MCH: 33.8 pg — AB (ref 26.6–33.0)
MCHC: 35.3 g/dL (ref 31.5–35.7)
MCV: 96 fL (ref 79–97)
MONOCYTES: 8 %
MONOS ABS: 0.5 10*3/uL (ref 0.1–0.9)
NEUTROS ABS: 3.3 10*3/uL (ref 1.4–7.0)
Neutrophils: 50 %
Platelets: 212 10*3/uL (ref 150–379)
RBC: 4.59 x10E6/uL (ref 4.14–5.80)
RDW: 12.8 % (ref 12.3–15.4)
WBC: 6.7 10*3/uL (ref 3.4–10.8)

## 2017-08-29 LAB — LIPID PANEL
Chol/HDL Ratio: 3.9 ratio (ref 0.0–5.0)
Cholesterol, Total: 160 mg/dL (ref 100–199)
HDL: 41 mg/dL (ref >39)
LDL Calculated: 92 mg/dL (ref 0–99)
Triglycerides: 134 mg/dL (ref 0–149)
VLDL CHOLESTEROL CAL: 27 mg/dL (ref 5–40)

## 2017-08-29 LAB — COMPREHENSIVE METABOLIC PANEL
A/G RATIO: 2.3 — AB (ref 1.2–2.2)
ALBUMIN: 4.8 g/dL (ref 3.5–5.5)
ALK PHOS: 66 IU/L (ref 39–117)
ALT: 27 IU/L (ref 0–44)
AST: 29 IU/L (ref 0–40)
BILIRUBIN TOTAL: 0.9 mg/dL (ref 0.0–1.2)
BUN / CREAT RATIO: 11 (ref 9–20)
BUN: 12 mg/dL (ref 6–20)
CALCIUM: 9.5 mg/dL (ref 8.7–10.2)
CO2: 22 mmol/L (ref 20–29)
Chloride: 103 mmol/L (ref 96–106)
Creatinine, Ser: 1.09 mg/dL (ref 0.76–1.27)
GFR calc Af Amer: 100 mL/min/{1.73_m2} (ref >59)
GFR calc non Af Amer: 86 mL/min/{1.73_m2} (ref >59)
GLOBULIN, TOTAL: 2.1 g/dL (ref 1.5–4.5)
GLUCOSE: 103 mg/dL — AB (ref 65–99)
POTASSIUM: 4.5 mmol/L (ref 3.5–5.2)
SODIUM: 143 mmol/L (ref 134–144)
Total Protein: 6.9 g/dL (ref 6.0–8.5)

## 2017-08-29 LAB — FERRITIN: FERRITIN: 126 ng/mL (ref 30–400)

## 2018-01-10 IMAGING — DX DG HAND COMPLETE 3+V*R*
3 series · 3 of 3 positions shown · non-contrast
Comparison: None.

CLINICAL DATA: Nail gun went off into patient's right thumb.
Initial encounter.

EXAM:
RIGHT HAND - COMPLETE 3+ VIEW

[hand ap]
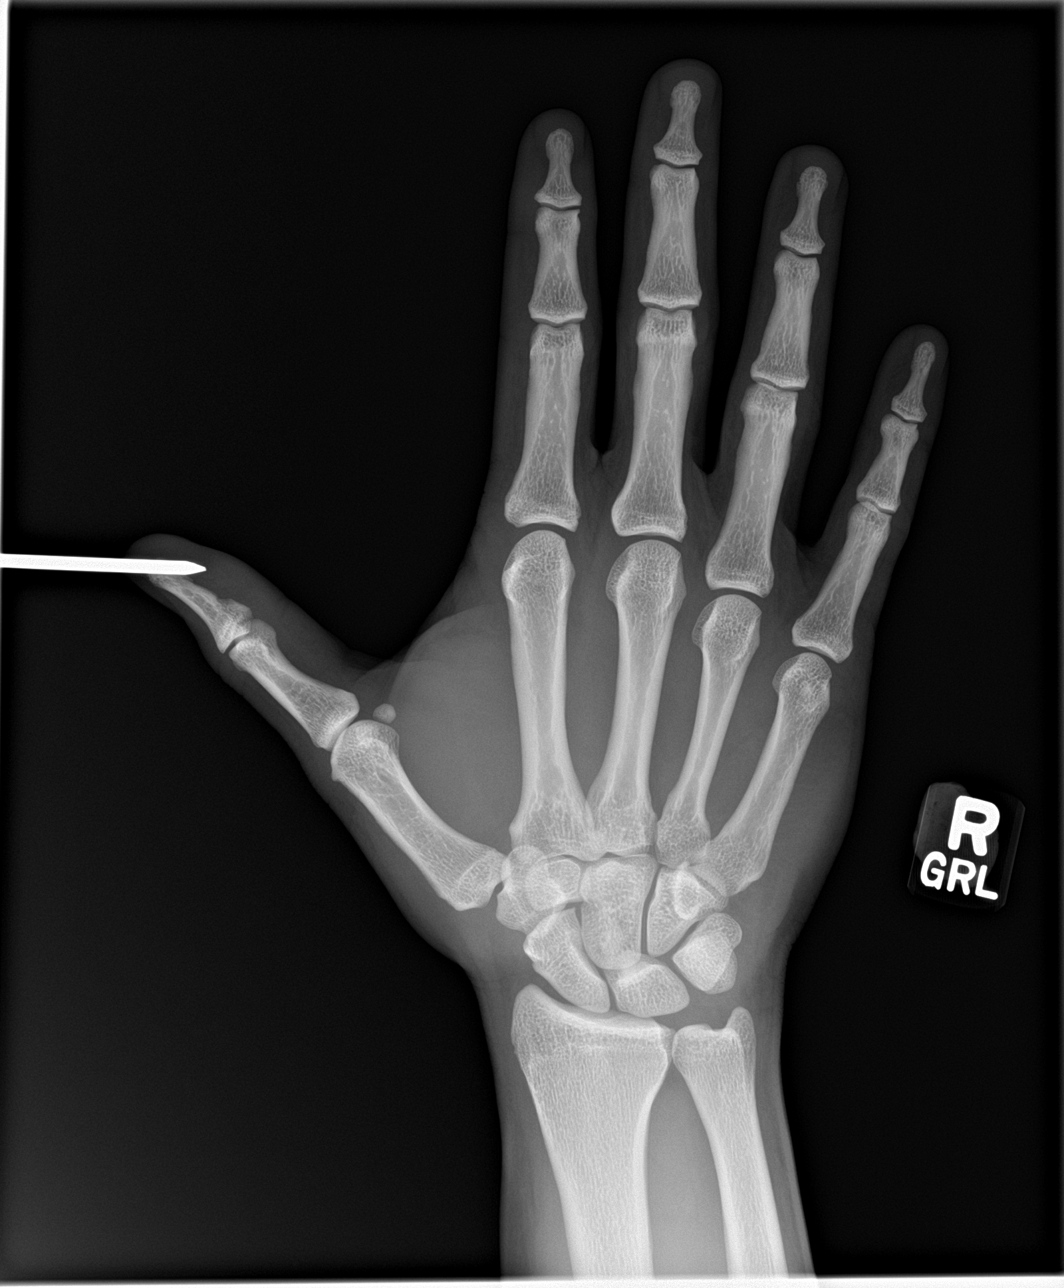

[hand obl]
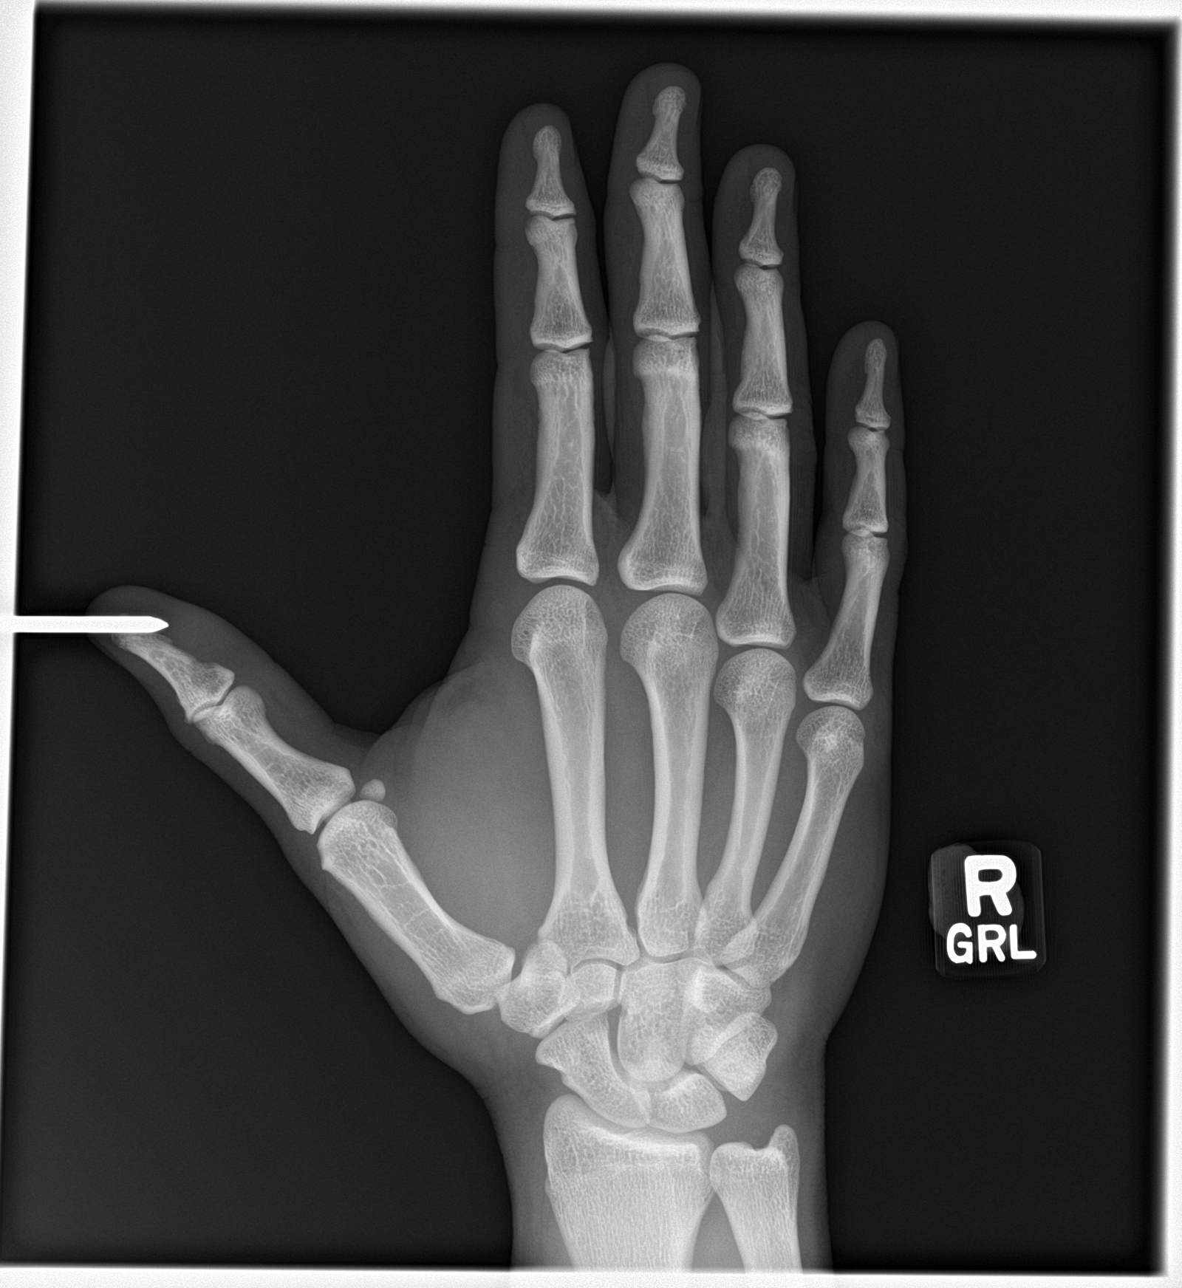

[hand lat]
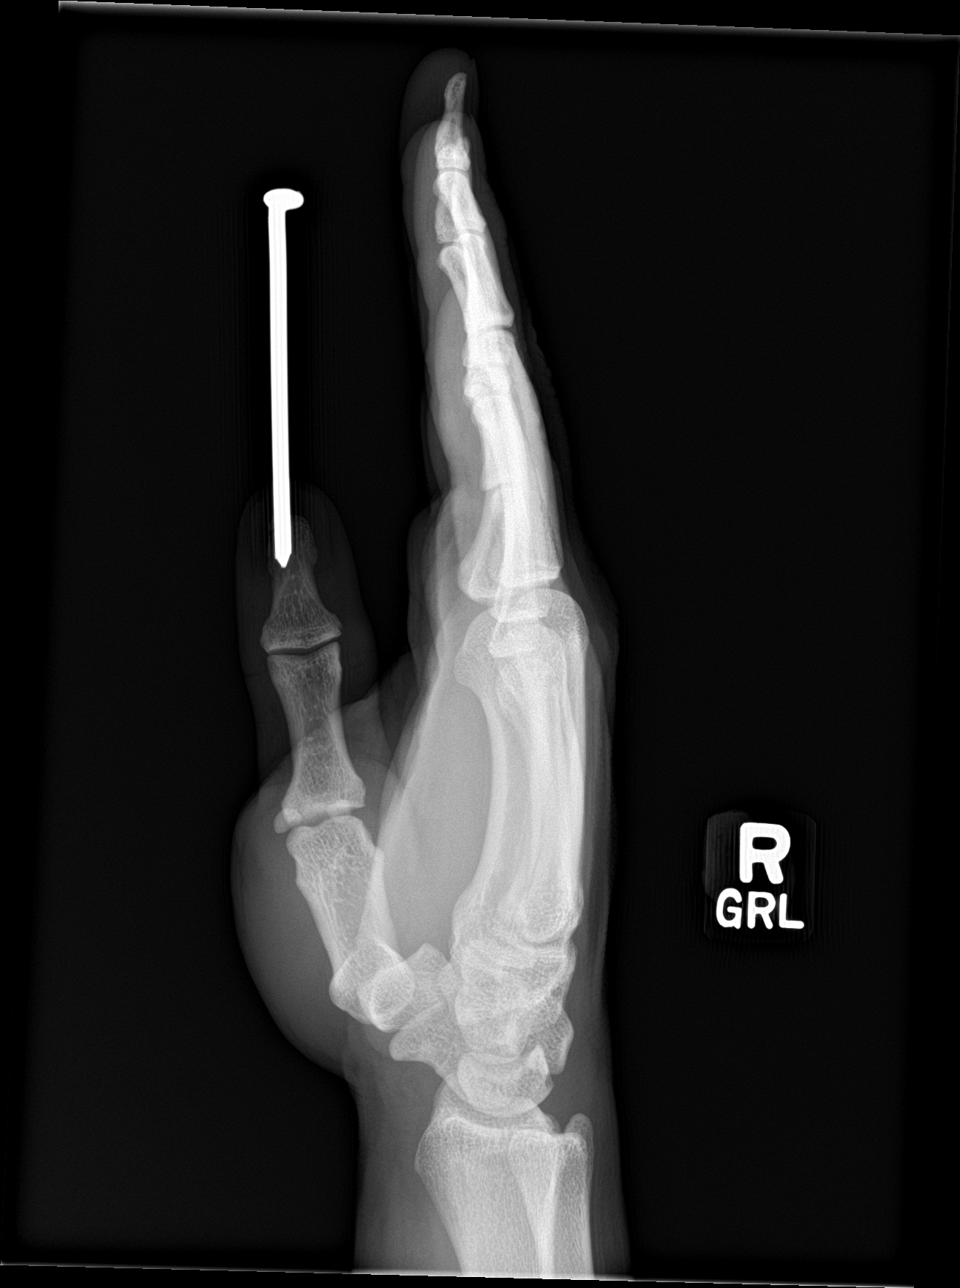

[3 of 3 positions shown; findings below may reference images not displayed]

FINDINGS: A metallic nail appears to be embedded at the distal tuft of the
first distal phalanx. Associated soft tissue swelling is noted.
Visualized joint spaces are preserved. The carpal rows appear
grossly intact, and demonstrate normal alignment.
IMPRESSION: Metallic nail appears to be embedded at the distal tuft of the first
distal phalanx.

## 2018-02-20 ENCOUNTER — Other Ambulatory Visit: Payer: Self-pay

## 2018-02-20 DIAGNOSIS — I10 Essential (primary) hypertension: Secondary | ICD-10-CM

## 2018-02-21 ENCOUNTER — Other Ambulatory Visit: Payer: Self-pay

## 2018-02-21 DIAGNOSIS — I10 Essential (primary) hypertension: Secondary | ICD-10-CM

## 2018-02-22 LAB — CBC WITH DIFFERENTIAL/PLATELET
BASOS ABS: 0 10*3/uL (ref 0.0–0.2)
Basos: 1 %
EOS (ABSOLUTE): 0.1 10*3/uL (ref 0.0–0.4)
Eos: 2 %
Hematocrit: 46.1 % (ref 37.5–51.0)
Hemoglobin: 15.7 g/dL (ref 13.0–17.7)
Immature Grans (Abs): 0 10*3/uL (ref 0.0–0.1)
Immature Granulocytes: 0 %
Lymphocytes Absolute: 1.9 10*3/uL (ref 0.7–3.1)
Lymphs: 36 %
MCH: 32.8 pg (ref 26.6–33.0)
MCHC: 34.1 g/dL (ref 31.5–35.7)
MCV: 96 fL (ref 79–97)
Monocytes Absolute: 0.5 10*3/uL (ref 0.1–0.9)
Monocytes: 8 %
NEUTROS ABS: 2.9 10*3/uL (ref 1.4–7.0)
NEUTROS PCT: 53 %
PLATELETS: 218 10*3/uL (ref 150–379)
RBC: 4.78 x10E6/uL (ref 4.14–5.80)
RDW: 12.9 % (ref 12.3–15.4)
WBC: 5.4 10*3/uL (ref 3.4–10.8)

## 2018-02-22 LAB — COMPREHENSIVE METABOLIC PANEL
A/G RATIO: 2 (ref 1.2–2.2)
ALK PHOS: 64 IU/L (ref 39–117)
ALT: 32 IU/L (ref 0–44)
AST: 30 IU/L (ref 0–40)
Albumin: 4.8 g/dL (ref 3.5–5.5)
BILIRUBIN TOTAL: 0.8 mg/dL (ref 0.0–1.2)
BUN/Creatinine Ratio: 12 (ref 9–20)
BUN: 13 mg/dL (ref 6–20)
CHLORIDE: 104 mmol/L (ref 96–106)
CO2: 22 mmol/L (ref 20–29)
Calcium: 9.4 mg/dL (ref 8.7–10.2)
Creatinine, Ser: 1.13 mg/dL (ref 0.76–1.27)
GFR calc Af Amer: 95 mL/min/{1.73_m2} (ref >59)
GFR calc non Af Amer: 82 mL/min/{1.73_m2} (ref >59)
GLUCOSE: 117 mg/dL — AB (ref 65–99)
Globulin, Total: 2.4 g/dL (ref 1.5–4.5)
POTASSIUM: 4.8 mmol/L (ref 3.5–5.2)
Sodium: 142 mmol/L (ref 134–144)
Total Protein: 7.2 g/dL (ref 6.0–8.5)

## 2018-02-22 LAB — FERRITIN: FERRITIN: 152 ng/mL (ref 30–400)

## 2018-07-14 NOTE — Progress Notes (Signed)
[facility]  Telephone:(336) [phone] Fax:(336) [phone]  ID: Francis Morrison OB: 11/12/1981  MR#: [id number]  NGE#:[id number]#:[id number]  Patient Care Team: Jerl MinaHedrick, James, MD as PCP - General (Family Medicine)  CHIEF COMPLAINT: Homozygous for hereditary hemachromatosis.  INTERVAL HISTORY: Patient was last evaluated in December 2017.  He returns to clinic today for further evaluation and consideration of phlebotomy after routine blood work at his employment noted increasing ferritin.  He currently feels well and is asymptomatic.  He has no neurologic complaints. He denies any recent fevers or illnesses. He has a good appetite and denies weight loss. He denies any chest pain or shortness of breath. He has no abdominal pain and denies any nausea, vomiting, constipation, or diarrhea. He has no urinary complaints.  Patient feels at his baseline offers no specific complaints today.  REVIEW OF SYSTEMS:   Review of Systems  Constitutional: Negative.  Negative for fever, malaise/fatigue and weight loss.  Respiratory: Negative.  Negative for cough and shortness of breath.   Cardiovascular: Negative.  Negative for chest pain and leg swelling.  Gastrointestinal: Negative.  Negative for abdominal pain, blood in stool and melena.  Genitourinary: Negative.  Negative for hematuria.  Musculoskeletal: Negative.  Negative for myalgias.  Skin: Negative.  Negative for rash.  Neurological: Negative.  Negative for focal weakness, weakness and headaches.  Psychiatric/Behavioral: Negative.  The patient is not nervous/anxious.     As per HPI. Otherwise, a complete review of systems is negative.  PAST MEDICAL HISTORY: Past Medical History:  Diagnosis Date  . History of seizure   . Hypertension     PAST SURGICAL HISTORY: History reviewed. No pertinent surgical history.  FAMILY HISTORY: Reviewed and unchanged. No reported history of malignancy or chronic disease. No known history of  hemochromatosis.     ADVANCED DIRECTIVES:    HEALTH MAINTENANCE: Social History   Tobacco Use  . Smoking status: Former Games developermoker  . Smokeless tobacco: Never Used  Substance Use Topics  . Alcohol use: Yes    Comment: occasional  . Drug use: No     Colonoscopy:  PAP:  Bone density:  Lipid panel:  No Known Allergies  Current Outpatient Medications  Medication Sig Dispense Refill  . cetirizine (ZYRTEC) 10 MG tablet Take 10 mg by mouth daily.    Marland Kitchen. lisinopril (PRINIVIL,ZESTRIL) 10 MG tablet Take 10 mg by mouth daily.   10   No current facility-administered medications for this visit.     OBJECTIVE: Vitals:   07/16/18 1421  BP: 132/82  Pulse: (!) 103  Temp: (!) 97.1 F (36.2 C)     Body mass index is 23.87 kg/m.    ECOG FS:0 - Asymptomatic  General: Well-developed, well-nourished, no acute distress. Eyes: Pink conjunctiva, anicteric sclera. HEENT: Normocephalic, moist mucous membranes, clear oropharnyx. Lungs: Clear to auscultation bilaterally. Heart: Regular rate and rhythm. No rubs, murmurs, or gallops. Abdomen: Soft, nontender, nondistended. No organomegaly noted, normoactive bowel sounds. Musculoskeletal: No edema, cyanosis, or clubbing. Neuro: Alert, answering all questions appropriately. Cranial nerves grossly intact. Skin: No rashes or petechiae noted. Psych: Normal affect. Lymphatics: No cervical, calvicular, axillary or inguinal LAD.   LAB RESULTS:  Lab Results  Component Value Date   NA 142 02/21/2018   K 4.8 02/21/2018   CL 104 02/21/2018   CO2 22 02/21/2018   GLUCOSE 117 (H) 02/21/2018   BUN 13 02/21/2018   CREATININE 1.13 02/21/2018   CALCIUM 9.4 02/21/2018   PROT 7.2 02/21/2018   ALBUMIN 4.8 02/21/2018  AST 30 02/21/2018   ALT 32 02/21/2018   ALKPHOS 64 02/21/2018   BILITOT 0.8 02/21/2018   GFRNONAA 82 02/21/2018   GFRAA 95 02/21/2018    Lab Results  Component Value Date   WBC 6.9 07/16/2018   NEUTROABS 3.9 07/16/2018   HGB 15.8  07/16/2018   HCT 44.7 07/16/2018   MCV 95.3 07/16/2018   PLT 201 07/16/2018   Lab Results  Component Value Date   FERRITIN 91 07/16/2018     STUDIES: No results found.  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  PLAN:    1. Hereditary hemachromatosis: Although patient's ferritin is at goal of 91, patient wishes to proceed with phlebotomy today.  He will receive 500 mL phlebotomy in order to maintain his ferritin between 50 and 100.  Patient will return to clinic in 4 months with repeat laboratory work, further evaluation, and consideration of phlebotomy with [facility].  It appears patient only requires phlebotomy once or twice per year.   2. History of elevated liver enzymes: Unclear etiology. Possibly but unlikely related to hemochromatosis, phlebotomy as above. Monitor.  Recheck liver enzymes with next lab draw.  Next  I spent a total of 30 minutes face-to-face with the patient of which greater than 50% of the visit was spent in counseling and coordination of care as detailed above.   Patient expressed understanding and was in agreement with this plan. He also understands that He can call clinic at any time with any questions, concerns, or complaints.    Jeralyn Ruths, MD   07/20/2018 7:03 AM

## 2018-07-15 ENCOUNTER — Other Ambulatory Visit: Payer: Self-pay | Admitting: *Deleted

## 2018-07-16 ENCOUNTER — Inpatient Hospital Stay: Payer: BLUE CROSS/BLUE SHIELD

## 2018-07-16 ENCOUNTER — Other Ambulatory Visit: Payer: Self-pay

## 2018-07-16 ENCOUNTER — Inpatient Hospital Stay: Payer: BLUE CROSS/BLUE SHIELD | Attending: Oncology

## 2018-07-16 ENCOUNTER — Encounter: Payer: Self-pay | Admitting: Oncology

## 2018-07-16 ENCOUNTER — Inpatient Hospital Stay (HOSPITAL_BASED_OUTPATIENT_CLINIC_OR_DEPARTMENT_OTHER): Payer: BLUE CROSS/BLUE SHIELD | Admitting: Oncology

## 2018-07-16 LAB — CBC WITH DIFFERENTIAL/PLATELET
BASOS ABS: 0.1 10*3/uL (ref 0–0.1)
BASOS PCT: 1 %
Eosinophils Absolute: 0.1 10*3/uL (ref 0–0.7)
Eosinophils Relative: 1 %
HEMATOCRIT: 44.7 % (ref 40.0–52.0)
HEMOGLOBIN: 15.8 g/dL (ref 13.0–18.0)
Lymphocytes Relative: 36 %
Lymphs Abs: 2.5 10*3/uL (ref 1.0–3.6)
MCH: 33.7 pg (ref 26.0–34.0)
MCHC: 35.4 g/dL (ref 32.0–36.0)
MCV: 95.3 fL (ref 80.0–100.0)
Monocytes Absolute: 0.5 10*3/uL (ref 0.2–1.0)
Monocytes Relative: 7 %
NEUTROS ABS: 3.9 10*3/uL (ref 1.4–6.5)
NEUTROS PCT: 55 %
Platelets: 201 10*3/uL (ref 150–440)
RBC: 4.69 MIL/uL (ref 4.40–5.90)
RDW: 12.8 % (ref 11.5–14.5)
WBC: 6.9 10*3/uL (ref 3.8–10.6)

## 2018-07-16 LAB — FERRITIN: FERRITIN: 91 ng/mL (ref 24–336)

## 2018-07-16 NOTE — Progress Notes (Signed)
Per Enrique SackKendra RN per Dr. Orlie DakinFinnegan pt to receive phlebotomy at this time prior to Ferritin results.  Therapeutic phlebotomy performed per MD order removing 500cc using a 20 gauge PIV in rt AC starting at 1515 and ending at 1535.  Drink provided.  Pt and VS stable at discharge.

## 2018-07-16 NOTE — Progress Notes (Signed)
Patient here today for follow up and phlebotomy  

## 2018-08-20 ENCOUNTER — Other Ambulatory Visit: Payer: Self-pay

## 2018-08-20 DIAGNOSIS — Z0000 Encounter for general adult medical examination without abnormal findings: Secondary | ICD-10-CM

## 2018-08-21 LAB — COMPREHENSIVE METABOLIC PANEL
ALK PHOS: 78 IU/L (ref 39–117)
ALT: 31 IU/L (ref 0–44)
AST: 41 IU/L — AB (ref 0–40)
Albumin/Globulin Ratio: 2.1 (ref 1.2–2.2)
Albumin: 4.7 g/dL (ref 3.5–5.5)
BILIRUBIN TOTAL: 0.8 mg/dL (ref 0.0–1.2)
BUN/Creatinine Ratio: 9 (ref 9–20)
BUN: 10 mg/dL (ref 6–20)
CHLORIDE: 101 mmol/L (ref 96–106)
CO2: 23 mmol/L (ref 20–29)
CREATININE: 1.17 mg/dL (ref 0.76–1.27)
Calcium: 9.3 mg/dL (ref 8.7–10.2)
GFR calc Af Amer: 91 mL/min/{1.73_m2} (ref >59)
GFR calc non Af Amer: 79 mL/min/{1.73_m2} (ref >59)
Globulin, Total: 2.2 g/dL (ref 1.5–4.5)
Glucose: 109 mg/dL — ABNORMAL HIGH (ref 65–99)
POTASSIUM: 4.7 mmol/L (ref 3.5–5.2)
SODIUM: 141 mmol/L (ref 134–144)
Total Protein: 6.9 g/dL (ref 6.0–8.5)

## 2018-08-21 LAB — CBC WITH DIFFERENTIAL/PLATELET
BASOS ABS: 0 10*3/uL (ref 0.0–0.2)
Basos: 1 %
EOS (ABSOLUTE): 0.1 10*3/uL (ref 0.0–0.4)
Eos: 1 %
Hematocrit: 44.4 % (ref 37.5–51.0)
Hemoglobin: 15 g/dL (ref 13.0–17.7)
Immature Grans (Abs): 0 10*3/uL (ref 0.0–0.1)
Immature Granulocytes: 0 %
LYMPHS ABS: 1.8 10*3/uL (ref 0.7–3.1)
Lymphs: 35 %
MCH: 33 pg (ref 26.6–33.0)
MCHC: 33.8 g/dL (ref 31.5–35.7)
MCV: 98 fL — ABNORMAL HIGH (ref 79–97)
Monocytes Absolute: 0.5 10*3/uL (ref 0.1–0.9)
Monocytes: 10 %
Neutrophils Absolute: 2.8 10*3/uL (ref 1.4–7.0)
Neutrophils: 53 %
Platelets: 238 10*3/uL (ref 150–450)
RBC: 4.55 x10E6/uL (ref 4.14–5.80)
RDW: 12.8 % (ref 12.3–15.4)
WBC: 5.2 10*3/uL (ref 3.4–10.8)

## 2018-08-21 LAB — LIPID PANEL
CHOL/HDL RATIO: 4 ratio (ref 0.0–5.0)
Cholesterol, Total: 172 mg/dL (ref 100–199)
HDL: 43 mg/dL (ref >39)
LDL Calculated: 115 mg/dL — ABNORMAL HIGH (ref 0–99)
Triglycerides: 72 mg/dL (ref 0–149)
VLDL CHOLESTEROL CAL: 14 mg/dL (ref 5–40)

## 2018-08-21 LAB — URINALYSIS, ROUTINE W REFLEX MICROSCOPIC
BILIRUBIN UA: NEGATIVE
Glucose, UA: NEGATIVE
KETONES UA: NEGATIVE
LEUKOCYTES UA: NEGATIVE
Nitrite, UA: NEGATIVE
PH UA: 7.5 (ref 5.0–7.5)
PROTEIN UA: NEGATIVE
RBC UA: NEGATIVE
SPEC GRAV UA: 1.008 (ref 1.005–1.030)
Urobilinogen, Ur: 0.2 mg/dL (ref 0.2–1.0)

## 2018-08-21 LAB — FERRITIN
FERRITIN: 104 ng/mL (ref 30–400)
Ferritin: 104 ng/mL (ref 30–400)

## 2018-10-25 ENCOUNTER — Telehealth: Payer: Self-pay

## 2018-10-25 NOTE — Telephone Encounter (Signed)
Patient will get his immunization records from his primary care doctor and will follow up with the travel clinic.  Sent an email with phone number and [place]

## 2018-11-22 ENCOUNTER — Other Ambulatory Visit: Payer: Self-pay | Admitting: *Deleted

## 2018-11-22 NOTE — Progress Notes (Signed)
c 

## 2018-11-24 NOTE — Progress Notes (Deleted)
[facility]  Telephone:(336) [phone] Fax:(336) [phone]  ID: Francis Morrison OB: 09-Jun-1981  MR#: [id number]  AOZ#:[id number]#:[id number]  Patient Care Team: Jerl MinaHedrick, James, MD as PCP - General (Family Medicine)  CHIEF COMPLAINT: Homozygous for hereditary hemachromatosis.  INTERVAL HISTORY: Patient was last evaluated in December 2017.  He returns to clinic today for further evaluation and consideration of phlebotomy after routine blood work at his employment noted increasing ferritin.  He currently feels well and is asymptomatic.  He has no neurologic complaints. He denies any recent fevers or illnesses. He has a good appetite and denies weight loss. He denies any chest pain or shortness of breath. He has no abdominal pain and denies any nausea, vomiting, constipation, or diarrhea. He has no urinary complaints.  Patient feels at his baseline offers no specific complaints today.  REVIEW OF SYSTEMS:   Review of Systems  Constitutional: Negative.  Negative for fever, malaise/fatigue and weight loss.  Respiratory: Negative.  Negative for cough and shortness of breath.   Cardiovascular: Negative.  Negative for chest pain and leg swelling.  Gastrointestinal: Negative.  Negative for abdominal pain, blood in stool and melena.  Genitourinary: Negative.  Negative for hematuria.  Musculoskeletal: Negative.  Negative for myalgias.  Skin: Negative.  Negative for rash.  Neurological: Negative.  Negative for focal weakness, weakness and headaches.  Psychiatric/Behavioral: Negative.  The patient is not nervous/anxious.     As per HPI. Otherwise, a complete review of systems is negative.  PAST MEDICAL HISTORY: Past Medical History:  Diagnosis Date  . History of seizure   . Hypertension     PAST SURGICAL HISTORY: No past surgical history on file.  FAMILY HISTORY: Reviewed and unchanged. No reported history of malignancy or chronic disease. No known history of  hemochromatosis.     ADVANCED DIRECTIVES:    HEALTH MAINTENANCE: Social History   Tobacco Use  . Smoking status: Former Games developermoker  . Smokeless tobacco: Never Used  Substance Use Topics  . Alcohol use: Yes    Comment: occasional  . Drug use: No     Colonoscopy:  PAP:  Bone density:  Lipid panel:  No Known Allergies  Current Outpatient Medications  Medication Sig Dispense Refill  . cetirizine (ZYRTEC) 10 MG tablet Take 10 mg by mouth daily.    Marland Kitchen. lisinopril (PRINIVIL,ZESTRIL) 10 MG tablet Take 10 mg by mouth daily.   10   No current facility-administered medications for this visit.     OBJECTIVE: There were no vitals filed for this visit.   There is no height or weight on file to calculate BMI.    ECOG FS:0 - Asymptomatic  General: Well-developed, well-nourished, no acute distress. Eyes: Pink conjunctiva, anicteric sclera. HEENT: Normocephalic, moist mucous membranes, clear oropharnyx. Lungs: Clear to auscultation bilaterally. Heart: Regular rate and rhythm. No rubs, murmurs, or gallops. Abdomen: Soft, nontender, nondistended. No organomegaly noted, normoactive bowel sounds. Musculoskeletal: No edema, cyanosis, or clubbing. Neuro: Alert, answering all questions appropriately. Cranial nerves grossly intact. Skin: No rashes or petechiae noted. Psych: Normal affect. Lymphatics: No cervical, calvicular, axillary or inguinal LAD.   LAB RESULTS:  Lab Results  Component Value Date   NA 141 08/20/2018   K 4.7 08/20/2018   CL 101 08/20/2018   CO2 23 08/20/2018   GLUCOSE 109 (H) 08/20/2018   BUN 10 08/20/2018   CREATININE 1.17 08/20/2018   CALCIUM 9.3 08/20/2018   PROT 6.9 08/20/2018   ALBUMIN 4.7 08/20/2018   AST 41 (H) 08/20/2018  ALT 31 08/20/2018   ALKPHOS 78 08/20/2018   BILITOT 0.8 08/20/2018   GFRNONAA 79 08/20/2018   GFRAA 91 08/20/2018    Lab Results  Component Value Date   WBC 5.2 08/20/2018   NEUTROABS 2.8 08/20/2018   HGB 15.0 08/20/2018   HCT  44.4 08/20/2018   MCV 98 (H) 08/20/2018   PLT 238 08/20/2018   Lab Results  Component Value Date   FERRITIN 104 08/20/2018   FERRITIN 104 08/20/2018     STUDIES: No results found.  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  PLAN:    1. Hereditary hemachromatosis: Although patient's ferritin is at goal of 91, patient wishes to proceed with phlebotomy today.  He will receive 500 mL phlebotomy in order to maintain his ferritin between 50 and 100.  Patient will return to clinic in 4 months with repeat laboratory work, further evaluation, and consideration of phlebotomy with [facility].  It appears patient only requires phlebotomy once or twice per year.   2. History of elevated liver enzymes: Unclear etiology. Possibly but unlikely related to hemochromatosis, phlebotomy as above. Monitor.  Recheck liver enzymes with next lab draw.  Next  I spent a total of 30 minutes face-to-face with the patient of which greater than 50% of the visit was spent in counseling and coordination of care as detailed above.   Patient expressed understanding and was in agreement with this plan. He also understands that He can call clinic at any time with any questions, concerns, or complaints.    Jeralyn Ruthsimothy J Aloysuis Ribaudo, MD   11/24/2018 9:20 AM

## 2018-11-25 ENCOUNTER — Inpatient Hospital Stay: Payer: BLUE CROSS/BLUE SHIELD

## 2018-11-25 ENCOUNTER — Telehealth: Payer: Self-pay | Admitting: Oncology

## 2018-11-25 ENCOUNTER — Inpatient Hospital Stay: Payer: BLUE CROSS/BLUE SHIELD | Admitting: Oncology

## 2018-11-25 NOTE — Telephone Encounter (Signed)
Had to leave pt a VM that [facility] could not do his Phlebotomy today and we need to r\s his appt.

## 2019-01-10 NOTE — Progress Notes (Signed)
[facility]  Telephone:(336) [phone] Fax:(336) [phone]  ID: Lum Babe OB: 1981/05/07  MR#: [id number]  YBO#:[id number]  Patient Care Team: Jerl Mina, MD as PCP - General (Family Medicine)  CHIEF COMPLAINT: Homozygous for hereditary hemachromatosis.  INTERVAL HISTORY: Patient returns to clinic today for further evaluation and continuation of phlebotomy.  He continues to feel well and remains asymptomatic. He has no neurologic complaints. He denies any recent fevers or illnesses. He has a good appetite and denies weight loss. He denies any chest pain or shortness of breath. He has no abdominal pain and denies any nausea, vomiting, constipation, or diarrhea. He has no urinary complaints.  Patient feels at his baseline offers no specific complaints today.  REVIEW OF SYSTEMS:   Review of Systems  Constitutional: Negative.  Negative for fever, malaise/fatigue and weight loss.  Respiratory: Negative.  Negative for cough and shortness of breath.   Cardiovascular: Negative.  Negative for chest pain and leg swelling.  Gastrointestinal: Negative.  Negative for abdominal pain, blood in stool and melena.  Genitourinary: Negative.  Negative for hematuria.  Musculoskeletal: Negative.  Negative for myalgias.  Skin: Negative.  Negative for rash.  Neurological: Negative.  Negative for focal weakness, weakness and headaches.  Psychiatric/Behavioral: Negative.  The patient is not nervous/anxious.     As per HPI. Otherwise, a complete review of systems is negative.  PAST MEDICAL HISTORY: Past Medical History:  Diagnosis Date  . History of seizure   . Hypertension     PAST SURGICAL HISTORY: No past surgical history on file.  FAMILY HISTORY: Reviewed and unchanged. No reported history of malignancy or chronic disease. No known history of hemochromatosis.     ADVANCED DIRECTIVES:    HEALTH MAINTENANCE: Social History   Tobacco Use  . Smoking status: Former  Games developer  . Smokeless tobacco: Never Used  Substance Use Topics  . Alcohol use: Yes    Comment: occasional  . Drug use: No     Colonoscopy:  PAP:  Bone density:  Lipid panel:  No Known Allergies  Current Outpatient Medications  Medication Sig Dispense Refill  . cetirizine (ZYRTEC) 10 MG tablet Take 10 mg by mouth daily.     No current facility-administered medications for this visit.     OBJECTIVE: Vitals:   01/14/19 1001  BP: (!) 142/96  Pulse: 100  Temp: (!) 97.3 F (36.3 C)     Body mass index is 24.62 kg/m.    ECOG FS:0 - Asymptomatic  General: Well-developed, well-nourished, no acute distress. Eyes: Pink conjunctiva, anicteric sclera. HEENT: Normocephalic, moist mucous membranes. Lungs: Clear to auscultation bilaterally. Heart: Regular rate and rhythm. No rubs, murmurs, or gallops. Abdomen: Soft, nontender, nondistended. No organomegaly noted, normoactive bowel sounds. Musculoskeletal: No edema, cyanosis, or clubbing. Neuro: Alert, answering all questions appropriately. Cranial nerves grossly intact. Skin: No rashes or petechiae noted. Psych: Normal affect.  LAB RESULTS:  Lab Results  Component Value Date   NA 141 08/20/2018   K 4.7 08/20/2018   CL 101 08/20/2018   CO2 23 08/20/2018   GLUCOSE 109 (H) 08/20/2018   BUN 10 08/20/2018   CREATININE 1.17 08/20/2018   CALCIUM 9.3 08/20/2018   PROT 6.9 08/20/2018   ALBUMIN 4.7 08/20/2018   AST 41 (H) 08/20/2018   ALT 31 08/20/2018   ALKPHOS 78 08/20/2018   BILITOT 0.8 08/20/2018   GFRNONAA 79 08/20/2018   GFRAA 91 08/20/2018    Lab Results  Component Value Date   WBC 6.6 01/14/2019  NEUTROABS 4.1 01/14/2019   HGB 15.2 01/14/2019   HCT 43.7 01/14/2019   MCV 91.4 01/14/2019   PLT 245 01/14/2019   Lab Results  Component Value Date   FERRITIN 83 01/14/2019     STUDIES: No results found.  ASSESSMENT: Homozygous for hereditary hemochromatosis with 2 mutations identified, C282Y and  H63D.  PLAN:    1. Hereditary hemachromatosis: Although patient's ferritin remains at goal at 83, he wishes to continue with phlebotomy.  Proceed with 500 mL phlebotomy with [facility] in order to maintain his ferritin between 50 and 100.  Return to clinic in 6 months with repeat laboratory work and further evaluation. 2. History of elevated liver enzymes: Essentially resolved.  I spent a total of 20 minutes face-to-face with the patient of which greater than 50% of the visit was spent in counseling and coordination of care as detailed above.   Patient expressed understanding and was in agreement with this plan. He also understands that He can call clinic at any time with any questions, concerns, or complaints.    Jeralyn Ruths, MD   01/16/2019 6:56 AM

## 2019-01-14 ENCOUNTER — Other Ambulatory Visit: Payer: Self-pay

## 2019-01-14 ENCOUNTER — Inpatient Hospital Stay: Payer: BLUE CROSS/BLUE SHIELD | Attending: Oncology

## 2019-01-14 ENCOUNTER — Inpatient Hospital Stay (HOSPITAL_BASED_OUTPATIENT_CLINIC_OR_DEPARTMENT_OTHER): Payer: BLUE CROSS/BLUE SHIELD | Admitting: Oncology

## 2019-01-14 ENCOUNTER — Inpatient Hospital Stay: Payer: BLUE CROSS/BLUE SHIELD

## 2019-01-14 LAB — CBC WITH DIFFERENTIAL/PLATELET
ABS IMMATURE GRANULOCYTES: 0.03 10*3/uL (ref 0.00–0.07)
Basophils Absolute: 0 10*3/uL (ref 0.0–0.1)
Basophils Relative: 1 %
Eosinophils Absolute: 0.1 10*3/uL (ref 0.0–0.5)
Eosinophils Relative: 1 %
HEMATOCRIT: 43.7 % (ref 39.0–52.0)
Hemoglobin: 15.2 g/dL (ref 13.0–17.0)
Immature Granulocytes: 1 %
LYMPHS ABS: 2.1 10*3/uL (ref 0.7–4.0)
Lymphocytes Relative: 31 %
MCH: 31.8 pg (ref 26.0–34.0)
MCHC: 34.8 g/dL (ref 30.0–36.0)
MCV: 91.4 fL (ref 80.0–100.0)
MONOS PCT: 6 %
Monocytes Absolute: 0.4 10*3/uL (ref 0.1–1.0)
NEUTROS ABS: 4.1 10*3/uL (ref 1.7–7.7)
NEUTROS PCT: 60 %
Platelets: 245 10*3/uL (ref 150–400)
RBC: 4.78 MIL/uL (ref 4.22–5.81)
RDW: 12 % (ref 11.5–15.5)
WBC: 6.6 10*3/uL (ref 4.0–10.5)
nRBC: 0 % (ref 0.0–0.2)

## 2019-01-14 LAB — FERRITIN: Ferritin: 83 ng/mL (ref 24–336)

## 2019-01-14 NOTE — Progress Notes (Signed)
Patient is here today to follow up on his hereditary hemochromatosis. Patient stated that after vacationing last month, he had been feeling tired specially at night time after he gets out of work.

## 2019-01-15 ENCOUNTER — Other Ambulatory Visit: Payer: BLUE CROSS/BLUE SHIELD

## 2019-01-15 ENCOUNTER — Ambulatory Visit: Payer: BLUE CROSS/BLUE SHIELD | Admitting: Oncology

## 2019-07-25 ENCOUNTER — Other Ambulatory Visit: Payer: Self-pay

## 2019-07-25 NOTE — Progress Notes (Signed)
[facility]  Telephone:(336) [phone] Fax:(336) [phone]  ID: Francis Morrison OB: 08/08/1981  MR#: [id number]  UXL#:[id number]#:[id number]  Patient Care Team: Jerl MinaHedrick, James, MD as PCP - General (Family Medicine)  CHIEF COMPLAINT: Homozygous for hereditary hemachromatosis.  INTERVAL HISTORY: Patient returns to clinic today for further evaluation and continuation of phlebotomy.  States he notes increasing lethargy in the several weeks leading up to his scheduled appointments.  He otherwise feels well and is asymptomatic.  He has no neurologic complaints. He denies any recent fevers or illnesses. He has a good appetite and denies weight loss.  He denies any chest pain, shortness of breath, cough, or hemoptysis.  He has no abdominal pain and denies any nausea, vomiting, constipation, or diarrhea. He has no urinary complaints.  Patient offers no further specific complaints today.  REVIEW OF SYSTEMS:   Review of Systems  Constitutional: Positive for malaise/fatigue. Negative for fever and weight loss.  Respiratory: Negative.  Negative for cough and shortness of breath.   Cardiovascular: Negative.  Negative for chest pain and leg swelling.  Gastrointestinal: Negative.  Negative for abdominal pain, blood in stool and melena.  Genitourinary: Negative.  Negative for hematuria.  Musculoskeletal: Negative.  Negative for myalgias.  Skin: Negative.  Negative for rash.  Neurological: Negative.  Negative for dizziness, focal weakness, weakness and headaches.  Psychiatric/Behavioral: Negative.  The patient is not nervous/anxious.     As per HPI. Otherwise, a complete review of systems is negative.  PAST MEDICAL HISTORY: Past Medical History:  Diagnosis Date  . History of seizure   . Hypertension     PAST SURGICAL HISTORY: History reviewed. No pertinent surgical history.  FAMILY HISTORY: Reviewed and unchanged. No reported history of malignancy or chronic disease. No known history of  hemochromatosis.     ADVANCED DIRECTIVES:    HEALTH MAINTENANCE: Social History   Tobacco Use  . Smoking status: Former Games developermoker  . Smokeless tobacco: Never Used  Substance Use Topics  . Alcohol use: Yes    Comment: occasional  . Drug use: No     Colonoscopy:  PAP:  Bone density:  Lipid panel:  No Known Allergies  Current Outpatient Medications  Medication Sig Dispense Refill  . cetirizine (ZYRTEC) 10 MG tablet Take 10 mg by mouth daily.    . Multiple Vitamins-Minerals (MULTIVITAMIN ADULT PO) Take 1 tablet by mouth 1 day or 1 dose.     No current facility-administered medications for this visit.     OBJECTIVE: Vitals:   07/28/19 1402  BP: (!) 141/86  Pulse: 83  Temp: 98 F (36.7 C)     Body mass index is 24.68 kg/m.    ECOG FS:0 - Asymptomatic  General: Well-developed, well-nourished, no acute distress. Eyes: Pink conjunctiva, anicteric sclera. HEENT: Normocephalic, moist mucous membranes. Lungs: Clear to auscultation bilaterally. Heart: Regular rate and rhythm. No rubs, murmurs, or gallops. Abdomen: Soft, nontender, nondistended. No organomegaly noted, normoactive bowel sounds. Musculoskeletal: No edema, cyanosis, or clubbing. Neuro: Alert, answering all questions appropriately. Cranial nerves grossly intact. Skin: No rashes or petechiae noted. Psych: Normal affect.  LAB RESULTS:  Lab Results  Component Value Date   NA 141 08/20/2018   K 4.7 08/20/2018   CL 101 08/20/2018   CO2 23 08/20/2018   GLUCOSE 109 (H) 08/20/2018   BUN 10 08/20/2018   CREATININE 1.17 08/20/2018   CALCIUM 9.3 08/20/2018   PROT 6.9 08/20/2018   ALBUMIN 4.7 08/20/2018   AST 41 (H) 08/20/2018   ALT  31 08/20/2018   ALKPHOS 78 08/20/2018   BILITOT 0.8 08/20/2018   GFRNONAA 79 08/20/2018   GFRAA 91 08/20/2018    Lab Results  Component Value Date   WBC 7.7 07/28/2019   NEUTROABS 4.4 07/28/2019   HGB 15.2 07/28/2019   HCT 42.8 07/28/2019   MCV 91.5 07/28/2019   PLT 201  07/28/2019   Lab Results  Component Value Date   FERRITIN 79 07/28/2019     STUDIES: No results found.  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  PLAN:    1. Hereditary hemachromatosis: Patient continues to be at his goal between 47 and 100 with a ferritin of 79.  Because he feels mildly symptomatic, he wishes to proceed with 500 mL phlebotomy today.  He does not wish to increase frequency of his appointments or phlebotomy.  Return to clinic in 6 months with repeat laboratory work and further evaluation.   2. History of elevated liver enzymes: Resolved.  I spent a total of 20 minutes face-to-face with the patient of which greater than 50% of the visit was spent in counseling and coordination of care as detailed above.   Patient expressed understanding and was in agreement with this plan. He also understands that He can call clinic at any time with any questions, concerns, or complaints.    Lloyd Huger, MD   07/28/2019 4:11 PM

## 2019-07-28 ENCOUNTER — Inpatient Hospital Stay: Payer: BC Managed Care – PPO | Attending: Oncology

## 2019-07-28 ENCOUNTER — Other Ambulatory Visit: Payer: Self-pay

## 2019-07-28 ENCOUNTER — Inpatient Hospital Stay (HOSPITAL_BASED_OUTPATIENT_CLINIC_OR_DEPARTMENT_OTHER): Payer: BC Managed Care – PPO | Admitting: Oncology

## 2019-07-28 ENCOUNTER — Inpatient Hospital Stay: Payer: BC Managed Care – PPO

## 2019-07-28 ENCOUNTER — Encounter: Payer: Self-pay | Admitting: Oncology

## 2019-07-28 LAB — CBC WITH DIFFERENTIAL/PLATELET
Abs Immature Granulocytes: 0.03 10*3/uL (ref 0.00–0.07)
Basophils Absolute: 0.1 10*3/uL (ref 0.0–0.1)
Basophils Relative: 1 %
Eosinophils Absolute: 0.2 10*3/uL (ref 0.0–0.5)
Eosinophils Relative: 2 %
HCT: 42.8 % (ref 39.0–52.0)
Hemoglobin: 15.2 g/dL (ref 13.0–17.0)
Immature Granulocytes: 0 %
Lymphocytes Relative: 33 %
Lymphs Abs: 2.6 10*3/uL (ref 0.7–4.0)
MCH: 32.5 pg (ref 26.0–34.0)
MCHC: 35.5 g/dL (ref 30.0–36.0)
MCV: 91.5 fL (ref 80.0–100.0)
Monocytes Absolute: 0.5 10*3/uL (ref 0.1–1.0)
Monocytes Relative: 7 %
Neutro Abs: 4.4 10*3/uL (ref 1.7–7.7)
Neutrophils Relative %: 57 %
Platelets: 201 10*3/uL (ref 150–400)
RBC: 4.68 MIL/uL (ref 4.22–5.81)
RDW: 11.9 % (ref 11.5–15.5)
WBC: 7.7 10*3/uL (ref 4.0–10.5)
nRBC: 0 % (ref 0.0–0.2)

## 2019-07-28 LAB — FERRITIN: Ferritin: 79 ng/mL (ref 24–336)

## 2019-07-28 NOTE — Progress Notes (Signed)
Patient stated that he had been feeling tired and fatigued.

## 2019-09-24 ENCOUNTER — Other Ambulatory Visit: Payer: Self-pay

## 2019-09-24 ENCOUNTER — Ambulatory Visit: Payer: Self-pay

## 2019-09-24 DIAGNOSIS — Z23 Encounter for immunization: Secondary | ICD-10-CM

## 2020-01-31 NOTE — Progress Notes (Signed)
[facility]  Telephone:([phone] Fax:(336) [phone]  ID: Lum Babe OB: Mar 02, 1981  MR#: [id number]  CLE#:[id number]  Patient Care Team: Jerl Mina, MD as PCP - General (Family Medicine)  CHIEF COMPLAINT: Homozygous for hereditary hemachromatosis.  INTERVAL HISTORY: Patient returns to clinic today for further evaluation, laboratory work, and continuation of phlebotomy.  Patient states he has increasing lethargy over the past 1 to 2 weeks but otherwise has felt well and is asymptomatic.  He continues to be active and work full-time.  He has no neurologic complaints. He denies any recent fevers or illnesses. He has a good appetite and denies weight loss.  He denies any chest pain, shortness of breath, cough, or hemoptysis.  He has no abdominal pain and denies any nausea, vomiting, constipation, or diarrhea. He has no urinary complaints.  Patient offers no further specific complaints today.  REVIEW OF SYSTEMS:   Review of Systems  Constitutional: Positive for malaise/fatigue. Negative for fever and weight loss.  Respiratory: Negative.  Negative for cough and shortness of breath.   Cardiovascular: Negative.  Negative for chest pain and leg swelling.  Gastrointestinal: Negative.  Negative for abdominal pain, blood in stool and melena.  Genitourinary: Negative.  Negative for hematuria.  Musculoskeletal: Negative.  Negative for myalgias.  Skin: Negative.  Negative for rash.  Neurological: Negative.  Negative for dizziness, focal weakness, weakness and headaches.  Psychiatric/Behavioral: Negative.  The patient is not nervous/anxious.     As per HPI. Otherwise, a complete review of systems is negative.  PAST MEDICAL HISTORY: Past Medical History:  Diagnosis Date  . History of seizure   . Hypertension     PAST SURGICAL HISTORY: No past surgical history on file.  FAMILY HISTORY: Reviewed and unchanged. No reported history of malignancy or chronic disease. No  known history of hemochromatosis.     ADVANCED DIRECTIVES:    HEALTH MAINTENANCE: Social History   Tobacco Use  . Smoking status: Former Games developer  . Smokeless tobacco: Never Used  Substance Use Topics  . Alcohol use: Yes    Comment: occasional  . Drug use: No     Colonoscopy:  PAP:  Bone density:  Lipid panel:  No Known Allergies  Current Outpatient Medications  Medication Sig Dispense Refill  . cetirizine (ZYRTEC) 10 MG tablet Take 10 mg by mouth daily.    . Multiple Vitamins-Minerals (MULTIVITAMIN ADULT PO) Take 1 tablet by mouth 1 day or 1 dose.     No current facility-administered medications for this visit.    OBJECTIVE: Vitals:   02/05/20 1400  BP: (!) 148/101  Pulse: 91  Temp: 98 F (36.7 C)     Body mass index is 25.97 kg/m.    ECOG FS:0 - Asymptomatic  General: Well-developed, well-nourished, no acute distress. Eyes: Pink conjunctiva, anicteric sclera. HEENT: Normocephalic, moist mucous membranes. Lungs: No audible wheezing or coughing. Heart: Regular rate and rhythm. Abdomen: Soft, nontender, no obvious distention. Musculoskeletal: No edema, cyanosis, or clubbing. Neuro: Alert, answering all questions appropriately. Cranial nerves grossly intact. Skin: No rashes or petechiae noted. Psych: Normal affect.   LAB RESULTS:  Lab Results  Component Value Date   NA 141 08/20/2018   K 4.7 08/20/2018   CL 101 08/20/2018   CO2 23 08/20/2018   GLUCOSE 109 (H) 08/20/2018   BUN 10 08/20/2018   CREATININE 1.17 08/20/2018   CALCIUM 9.3 08/20/2018   PROT 6.9 08/20/2018   ALBUMIN 4.7 08/20/2018   AST 41 (H) 08/20/2018  ALT 31 08/20/2018   ALKPHOS 78 08/20/2018   BILITOT 0.8 08/20/2018   GFRNONAA 79 08/20/2018   GFRAA 91 08/20/2018    Lab Results  Component Value Date   WBC 6.1 02/05/2020   NEUTROABS 3.5 02/05/2020   HGB 14.8 02/05/2020   HCT 43.2 02/05/2020   MCV 93.9 02/05/2020   PLT 184 02/05/2020   Lab Results  Component Value Date    FERRITIN 47 02/05/2020     STUDIES: No results found.  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  PLAN:    1. Hereditary hemachromatosis: Patient's goal ferritin is 50-100.  His ferritin is 47 today.  Despite this, he wishes to proceed with 500 mL phlebotomy stating he always feels better after receiving treatment.  We discussed the possibility of switching treatments to a yearly basis, but patient wishes to return every 6 months for laboratory work and phlebotomy.  Return to clinic in 6 months for laboratory and phlebotomy only and then in 1 year for further evaluation and continuation of treatment.   2. History of elevated liver enzymes: Resolved.  I spent a total of 20 minutes reviewing chart data, face-to-face evaluation with the patient, counseling and coordination of care as detailed above.    Patient expressed understanding and was in agreement with this plan. He also understands that He can call clinic at any time with any questions, concerns, or complaints.    Lloyd Huger, MD   02/06/2020 9:12 AM

## 2020-02-04 ENCOUNTER — Other Ambulatory Visit: Payer: Self-pay | Admitting: Emergency Medicine

## 2020-02-05 ENCOUNTER — Other Ambulatory Visit: Payer: Self-pay

## 2020-02-05 ENCOUNTER — Inpatient Hospital Stay (HOSPITAL_BASED_OUTPATIENT_CLINIC_OR_DEPARTMENT_OTHER): Payer: Self-pay | Admitting: Oncology

## 2020-02-05 ENCOUNTER — Inpatient Hospital Stay: Payer: Self-pay

## 2020-02-05 ENCOUNTER — Inpatient Hospital Stay: Payer: Self-pay | Attending: Oncology

## 2020-02-05 LAB — CBC WITH DIFFERENTIAL/PLATELET
Abs Immature Granulocytes: 0.02 10*3/uL (ref 0.00–0.07)
Basophils Absolute: 0 10*3/uL (ref 0.0–0.1)
Basophils Relative: 1 %
Eosinophils Absolute: 0 10*3/uL (ref 0.0–0.5)
Eosinophils Relative: 1 %
HCT: 43.2 % (ref 39.0–52.0)
Hemoglobin: 14.8 g/dL (ref 13.0–17.0)
Immature Granulocytes: 0 %
Lymphocytes Relative: 35 %
Lymphs Abs: 2.1 10*3/uL (ref 0.7–4.0)
MCH: 32.2 pg (ref 26.0–34.0)
MCHC: 34.3 g/dL (ref 30.0–36.0)
MCV: 93.9 fL (ref 80.0–100.0)
Monocytes Absolute: 0.4 10*3/uL (ref 0.1–1.0)
Monocytes Relative: 7 %
Neutro Abs: 3.5 10*3/uL (ref 1.7–7.7)
Neutrophils Relative %: 56 %
Platelets: 184 10*3/uL (ref 150–400)
RBC: 4.6 MIL/uL (ref 4.22–5.81)
RDW: 11.8 % (ref 11.5–15.5)
WBC: 6.1 10*3/uL (ref 4.0–10.5)
nRBC: 0 % (ref 0.0–0.2)

## 2020-02-05 LAB — FERRITIN: Ferritin: 47 ng/mL (ref 24–336)

## 2020-02-05 NOTE — Progress Notes (Signed)
Patient is here for follow up he is doing well no complaints  

## 2020-02-05 NOTE — Progress Notes (Signed)
Labs reviewed with MD and treatment team. Per MD to continue with phlebotomy today with removing 500 mL. Pt tolerated phlebotomy well with no signs of complications. VSS. Pt stable for discharge.   Eliese Kerwood [employer]

## 2020-04-19 ENCOUNTER — Ambulatory Visit: Payer: Self-pay | Admitting: Nurse Practitioner

## 2020-04-19 ENCOUNTER — Other Ambulatory Visit: Payer: Self-pay

## 2020-04-19 ENCOUNTER — Encounter: Payer: Self-pay | Admitting: Nurse Practitioner

## 2020-04-19 VITALS — BP 153/94 | HR 97 | Temp 98.9°F | Resp 16 | Wt 194.4 lb

## 2020-04-19 DIAGNOSIS — H5789 Other specified disorders of eye and adnexa: Secondary | ICD-10-CM

## 2020-04-19 MED ORDER — TOBRAMYCIN 0.3 % OP SOLN: 1.0000 [drp] | OPHTHALMIC | 0 refills | Status: AC

## 2020-04-19 NOTE — Progress Notes (Signed)
   Subjective:    Patient ID: Francis Morrison, male    DOB: 29-Jan-1981, 39 y.o.   MRN: [id number]  HPI Miroslav is here today with c/o left eye pain and redness since last night. He reports he feels like something is on the top of his "eyeball". He reports he felt like he had a lash in his eye last night and he took his contact out and flushed, then put them back in and has done this a few more times up until now which he's taken them out. He reports he has continued to flush his eye with his saline solution with no improvement in eye irritation. He reports he wears the 3 month contacts and has had them for about 2 months now. He denies any eye injury with last Tetanus 10/2016.  HTN: B/p is elevated but reports recent start of Lisinopril 2 weeks ago. Denies symptoms.   Review of Systems  Eyes: Positive for pain, discharge and visual disturbance. Negative for photophobia and itching.       Clear/watery       Objective:   Physical Exam Constitutional:      General: He is not in acute distress.    Appearance: Normal appearance. He is not ill-appearing.  HENT:     Head: Normocephalic and atraumatic.  Eyes:     General:        Right eye: No discharge.     Extraocular Movements: Extraocular movements intact.     Pupils: Pupils are equal, round, and reactive to light.     Comments: Left eye with erythema to the entire globe of the eye. No discharge from the eye with no evidence of a foreign object or abrasion. Left upper lid with small circular lesion and edematous with lower lid with edema. Fluorescein stain: negative and numbed with proparacaine gtts prior to use of stain/strip. Eye was flushed with saline in office and assessment of upper sclera with retraction of upper lid.  Musculoskeletal:     Cervical back: Neck supple.  Neurological:     Mental Status: He is alert and oriented to person, place, and time.  Psychiatric:        Mood and Affect: Mood normal.        Behavior: Behavior  normal.           Assessment & Plan:

## 2020-04-19 NOTE — Patient Instructions (Addendum)
Take your eye medication as directed No contacts until your left eye is completely healed If symptoms worsen or do not improve please return to the clinic for further evaluation

## 2020-08-12 ENCOUNTER — Inpatient Hospital Stay: Payer: BC Managed Care – PPO | Attending: Oncology

## 2020-08-12 ENCOUNTER — Inpatient Hospital Stay: Payer: BC Managed Care – PPO

## 2020-08-12 ENCOUNTER — Other Ambulatory Visit: Payer: Self-pay

## 2020-08-12 LAB — CBC WITH DIFFERENTIAL/PLATELET
Abs Immature Granulocytes: 0.01 10*3/uL (ref 0.00–0.07)
Basophils Absolute: 0 10*3/uL (ref 0.0–0.1)
Basophils Relative: 1 %
Eosinophils Absolute: 0.1 10*3/uL (ref 0.0–0.5)
Eosinophils Relative: 1 %
HCT: 42.2 % (ref 39.0–52.0)
Hemoglobin: 15 g/dL (ref 13.0–17.0)
Immature Granulocytes: 0 %
Lymphocytes Relative: 34 %
Lymphs Abs: 2.2 10*3/uL (ref 0.7–4.0)
MCH: 31.6 pg (ref 26.0–34.0)
MCHC: 35.5 g/dL (ref 30.0–36.0)
MCV: 88.8 fL (ref 80.0–100.0)
Monocytes Absolute: 0.5 10*3/uL (ref 0.1–1.0)
Monocytes Relative: 7 %
Neutro Abs: 3.8 10*3/uL (ref 1.7–7.7)
Neutrophils Relative %: 57 %
Platelets: 202 10*3/uL (ref 150–400)
RBC: 4.75 MIL/uL (ref 4.22–5.81)
RDW: 12.3 % (ref 11.5–15.5)
WBC: 6.7 10*3/uL (ref 4.0–10.5)
nRBC: 0 % (ref 0.0–0.2)

## 2020-08-12 LAB — FERRITIN: Ferritin: 31 ng/mL (ref 24–336)

## 2020-08-12 NOTE — Progress Notes (Signed)
Reviewed today's CBC (Ferritin results pending) and Ferritin from 02/05/20 with Dr. Orlie Dakin. Per Dr. Orlie Dakin proceed with 500cc phlebotomy as scheduled. Pt agrees with plan.

## 2020-11-13 ENCOUNTER — Ambulatory Visit: Payer: Self-pay | Attending: Internal Medicine

## 2020-11-13 DIAGNOSIS — Z23 Encounter for immunization: Secondary | ICD-10-CM

## 2020-11-13 NOTE — Progress Notes (Signed)
   Covid-19 Vaccination Clinic  Name:  Francis Morrison    MRN: [id number] DOB: October 26, 1981  11/13/2020  [name] was observed post Covid-19 immunization for 15 minutes without incident. He was provided with Vaccine Information Sheet and instruction to access the V-Safe system.   [name] was instructed to call 911 with any severe reactions post vaccine: Marland Kitchen Difficulty breathing  . Swelling of face and throat  . A fast heartbeat  . A bad rash all over body  . Dizziness and weakness   Immunizations Administered    No immunizations on file.

## 2021-02-05 NOTE — Progress Notes (Unsigned)
[facility]  Telephone:(336) [phone] Fax:(336) [phone]  ID: Francis Morrison OB: January 23, 1981  MR#: [id number]  SVX#:[id number]  Patient Care Team: Jerl Mina, MD as PCP - General (Family Medicine)  CHIEF COMPLAINT: Homozygous for hereditary hemachromatosis.  INTERVAL HISTORY: Patient returns to clinic today for routine yearly evaluation and continuation of phlebotomy.  He states he always feels lethargic 1 to 2 weeks prior to his scheduled phlebotomy, but this significantly improves afterwards.  He continues to be active and work full-time.  He has no neurologic complaints. He denies any recent fevers or illnesses. He has a good appetite and denies weight loss.  He denies any chest pain, shortness of breath, cough, or hemoptysis.  He has no abdominal pain and denies any nausea, vomiting, constipation, or diarrhea. He has no urinary complaints.  Patient offers no further specific complaints today.  REVIEW OF SYSTEMS:   Review of Systems  Constitutional: Positive for malaise/fatigue. Negative for fever and weight loss.  Respiratory: Negative.  Negative for cough and shortness of breath.   Cardiovascular: Negative.  Negative for chest pain and leg swelling.  Gastrointestinal: Negative.  Negative for abdominal pain, blood in stool and melena.  Genitourinary: Negative.  Negative for hematuria.  Musculoskeletal: Negative.  Negative for myalgias.  Skin: Negative.  Negative for rash.  Neurological: Negative.  Negative for dizziness, focal weakness, weakness and headaches.  Psychiatric/Behavioral: Negative.  The patient is not nervous/anxious.     As per HPI. Otherwise, a complete review of systems is negative.  PAST MEDICAL HISTORY: Past Medical History:  Diagnosis Date  . History of seizure   . Hypertension     PAST SURGICAL HISTORY: History reviewed. No pertinent surgical history.  FAMILY HISTORY: Reviewed and unchanged. No reported history of malignancy or  chronic disease. No known history of hemochromatosis.     ADVANCED DIRECTIVES:    HEALTH MAINTENANCE: Social History   Tobacco Use  . Smoking status: Former Games developer  . Smokeless tobacco: Never Used  Substance Use Topics  . Alcohol use: Yes    Comment: occasional  . Drug use: No     Colonoscopy:  PAP:  Bone density:  Lipid panel:  No Known Allergies  Current Outpatient Medications  Medication Sig Dispense Refill  . cetirizine (ZYRTEC) 10 MG tablet Take 10 mg by mouth daily.    . Multiple Vitamins-Minerals (MULTIVITAMIN ADULT PO) Take 1 tablet by mouth 1 day or 1 dose.    Marland Kitchen lisinopril (ZESTRIL) 10 MG tablet Take 10 mg by mouth daily. (Patient not taking: Reported on 02/10/2021)     No current facility-administered medications for this visit.    OBJECTIVE: Vitals:   02/10/21 1435  BP: 132/81  Pulse: 81  Temp: 97.8 F (36.6 C)  SpO2: 100%     Body mass index is 25.54 kg/m.    ECOG FS:0 - Asymptomatic  General: Well-developed, well-nourished, no acute distress. Eyes: Pink conjunctiva, anicteric sclera. HEENT: Normocephalic, moist mucous membranes. Lungs: No audible wheezing or coughing. Heart: Regular rate and rhythm. Abdomen: Soft, nontender, no obvious distention. Musculoskeletal: No edema, cyanosis, or clubbing. Neuro: Alert, answering all questions appropriately. Cranial nerves grossly intact. Skin: No rashes or petechiae noted. Psych: Normal affect.   LAB RESULTS:  Lab Results  Component Value Date   NA 141 08/20/2018   K 4.7 08/20/2018   CL 101 08/20/2018   CO2 23 08/20/2018   GLUCOSE 109 (H) 08/20/2018   BUN 10 08/20/2018   CREATININE 1.17 08/20/2018  CALCIUM 9.3 08/20/2018   PROT 6.9 08/20/2018   ALBUMIN 4.7 08/20/2018   AST 41 (H) 08/20/2018   ALT 31 08/20/2018   ALKPHOS 78 08/20/2018   BILITOT 0.8 08/20/2018   GFRNONAA 79 08/20/2018   GFRAA 91 08/20/2018    Lab Results  Component Value Date   WBC 7.5 02/10/2021   NEUTROABS 4.8  02/10/2021   HGB 14.7 02/10/2021   HCT 42.4 02/10/2021   MCV 89.3 02/10/2021   PLT 199 02/10/2021   Lab Results  Component Value Date   FERRITIN 19 (L) 02/10/2021     STUDIES: No results found.  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  PLAN:    1. Hereditary hemachromatosis: Patient's goal ferritin is 50-100.  Although patient's ferritin is decreased at 19 and well below his goal, he wishes to proceed with 500 mL phlebotomy anyway stating he always feels better after treatment.  He was given an order to have his phlebotomy done with [facility].  Return to clinic in 1 year for further evaluation and continuation of treatment.   I spent a total of 20 minutes reviewing chart data, face-to-face evaluation with the patient, counseling and coordination of care as detailed above.   Patient expressed understanding and was in agreement with this plan. He also understands that He can call clinic at any time with any questions, concerns, or complaints.    Jeralyn Ruths, MD   02/11/2021 12:21 PM

## 2021-02-10 ENCOUNTER — Encounter: Payer: Self-pay | Admitting: Oncology

## 2021-02-10 ENCOUNTER — Inpatient Hospital Stay: Payer: BC Managed Care – PPO

## 2021-02-10 ENCOUNTER — Inpatient Hospital Stay (HOSPITAL_BASED_OUTPATIENT_CLINIC_OR_DEPARTMENT_OTHER): Payer: BC Managed Care – PPO | Admitting: Oncology

## 2021-02-10 ENCOUNTER — Inpatient Hospital Stay: Payer: BC Managed Care – PPO | Attending: Oncology

## 2021-02-10 DIAGNOSIS — I10 Essential (primary) hypertension: Secondary | ICD-10-CM | POA: Insufficient documentation

## 2021-02-10 DIAGNOSIS — Z79899 Other long term (current) drug therapy: Secondary | ICD-10-CM | POA: Diagnosis not present

## 2021-02-10 DIAGNOSIS — Z87891 Personal history of nicotine dependence: Secondary | ICD-10-CM | POA: Diagnosis not present

## 2021-02-10 LAB — CBC WITH DIFFERENTIAL/PLATELET
Abs Immature Granulocytes: 0.01 10*3/uL (ref 0.00–0.07)
Basophils Absolute: 0 10*3/uL (ref 0.0–0.1)
Basophils Relative: 1 %
Eosinophils Absolute: 0.1 10*3/uL (ref 0.0–0.5)
Eosinophils Relative: 1 %
HCT: 42.4 % (ref 39.0–52.0)
Hemoglobin: 14.7 g/dL (ref 13.0–17.0)
Immature Granulocytes: 0 %
Lymphocytes Relative: 29 %
Lymphs Abs: 2.2 10*3/uL (ref 0.7–4.0)
MCH: 30.9 pg (ref 26.0–34.0)
MCHC: 34.7 g/dL (ref 30.0–36.0)
MCV: 89.3 fL (ref 80.0–100.0)
Monocytes Absolute: 0.5 10*3/uL (ref 0.1–1.0)
Monocytes Relative: 6 %
Neutro Abs: 4.8 10*3/uL (ref 1.7–7.7)
Neutrophils Relative %: 63 %
Platelets: 199 10*3/uL (ref 150–400)
RBC: 4.75 MIL/uL (ref 4.22–5.81)
RDW: 13 % (ref 11.5–15.5)
WBC: 7.5 10*3/uL (ref 4.0–10.5)
nRBC: 0 % (ref 0.0–0.2)

## 2021-02-10 LAB — FERRITIN: Ferritin: 19 ng/mL — ABNORMAL LOW (ref 24–336)

## 2021-02-10 NOTE — Progress Notes (Signed)
Patient tolerated 500 ml therapeutic phlebotomy today. Accepted a soda. No complaints of weakness or dizziness. States he feels fine and ready for discharge.

## 2021-02-10 NOTE — Progress Notes (Signed)
Patient denies any concerns today.  

## 2021-03-09 ENCOUNTER — Other Ambulatory Visit: Payer: Self-pay

## 2021-03-09 DIAGNOSIS — Z0000 Encounter for general adult medical examination without abnormal findings: Secondary | ICD-10-CM

## 2021-03-10 ENCOUNTER — Other Ambulatory Visit: Payer: BC Managed Care – PPO

## 2021-03-10 ENCOUNTER — Other Ambulatory Visit: Payer: Self-pay

## 2021-03-10 DIAGNOSIS — Z0000 Encounter for general adult medical examination without abnormal findings: Secondary | ICD-10-CM

## 2021-03-10 NOTE — Progress Notes (Signed)
Nurse visit for lab drawn.

## 2021-03-11 LAB — URINALYSIS, ROUTINE W REFLEX MICROSCOPIC
Bilirubin, UA: NEGATIVE
Glucose, UA: NEGATIVE
Ketones, UA: NEGATIVE
Leukocytes,UA: NEGATIVE
Nitrite, UA: NEGATIVE
Protein,UA: NEGATIVE
RBC, UA: NEGATIVE
Specific Gravity, UA: 1.009 (ref 1.005–1.030)
Urobilinogen, Ur: 0.2 mg/dL (ref 0.2–1.0)
pH, UA: 7 (ref 5.0–7.5)

## 2021-03-12 LAB — TESTOSTERONE,FREE AND TOTAL
Testosterone, Free: 15 pg/mL (ref 6.8–21.5)
Testosterone: 665 ng/dL (ref 264–916)

## 2021-03-12 LAB — COMPREHENSIVE METABOLIC PANEL
ALT: 22 IU/L (ref 0–44)
AST: 25 IU/L (ref 0–40)
Albumin/Globulin Ratio: 1.8 (ref 1.2–2.2)
Albumin: 4.8 g/dL (ref 4.0–5.0)
Alkaline Phosphatase: 69 IU/L (ref 44–121)
BUN/Creatinine Ratio: 16 (ref 9–20)
BUN: 17 mg/dL (ref 6–24)
Bilirubin Total: 0.6 mg/dL (ref 0.0–1.2)
CO2: 24 mmol/L (ref 20–29)
Calcium: 9.5 mg/dL (ref 8.7–10.2)
Chloride: 100 mmol/L (ref 96–106)
Creatinine, Ser: 1.09 mg/dL (ref 0.76–1.27)
Globulin, Total: 2.7 g/dL (ref 1.5–4.5)
Glucose: 121 mg/dL — ABNORMAL HIGH (ref 65–99)
Potassium: 4.9 mmol/L (ref 3.5–5.2)
Sodium: 139 mmol/L (ref 134–144)
Total Protein: 7.5 g/dL (ref 6.0–8.5)
eGFR: 87 mL/min/{1.73_m2} (ref >59)

## 2021-03-12 LAB — LIPID PANEL
Chol/HDL Ratio: 3.9 ratio (ref 0.0–5.0)
Cholesterol, Total: 193 mg/dL (ref 100–199)
HDL: 49 mg/dL (ref >39)
LDL Chol Calc (NIH): 132 mg/dL — ABNORMAL HIGH (ref 0–99)
Triglycerides: 67 mg/dL (ref 0–149)
VLDL Cholesterol Cal: 12 mg/dL (ref 5–40)

## 2021-03-12 LAB — PSA: Prostate Specific Ag, Serum: 0.7 ng/mL (ref 0.0–4.0)

## 2021-07-15 ENCOUNTER — Other Ambulatory Visit: Payer: Self-pay

## 2021-07-15 ENCOUNTER — Ambulatory Visit: Payer: BC Managed Care – PPO | Admitting: Podiatry

## 2021-07-15 DIAGNOSIS — B070 Plantar wart: Secondary | ICD-10-CM | POA: Diagnosis not present

## 2021-07-15 NOTE — Progress Notes (Signed)
   Subjective: 40 y.o. male presenting today as a new patient for evaluation of right forefoot pain is been going on for a few months now.  Patient states that he has tried to freeze the wart off with no improvement.  He continues to have pain and discomfort to the front of the foot.     Past Medical History:  Diagnosis Date   History of seizure    Hypertension     Objective: Physical Exam General: The patient is alert and oriented x3 in no acute distress.   Dermatology: Hyperkeratotic skin lesion(s) noted to the plantar aspect of the right foot approximately 1 cm in diameter. Pinpoint bleeding noted upon debridement. Skin is warm, dry and supple bilateral lower extremities. Negative for open lesions or macerations.   Vascular: Palpable pedal pulses bilaterally. No edema or erythema noted. Capillary refill within normal limits.   Neurological: Epicritic and protective threshold grossly intact bilaterally.    Musculoskeletal Exam: Pain on palpation to the noted skin lesion(s).  Range of motion within normal limits to all pedal and ankle joints bilateral. Muscle strength 5/5 in all groups bilateral.    Assessment: #1 plantar wart right foot #2 pain in right foot     Plan of Care:  #1 Patient was evaluated. #2 Excisional debridement of the plantar wart lesion(s) was performed using a chisel blade. Cantharone was applied and the lesion(s) was dressed with a dry sterile dressing. #3 patient is to return to clinic in 2 weeks.  *Goes by [employer].  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 Felecia Shelling, DPM    [address] [place], [place] [address]                Office [phone]  Fax [phone]

## 2021-08-19 ENCOUNTER — Ambulatory Visit: Payer: BC Managed Care – PPO | Admitting: Podiatry

## 2021-08-23 ENCOUNTER — Ambulatory Visit: Payer: BC Managed Care – PPO | Admitting: Podiatry

## 2021-08-24 ENCOUNTER — Ambulatory Visit: Payer: BC Managed Care – PPO

## 2021-09-09 ENCOUNTER — Ambulatory Visit: Payer: BC Managed Care – PPO | Admitting: Podiatry

## 2021-09-09 ENCOUNTER — Encounter: Payer: Self-pay | Admitting: Podiatry

## 2021-09-09 ENCOUNTER — Other Ambulatory Visit: Payer: Self-pay

## 2021-09-09 DIAGNOSIS — B070 Plantar wart: Secondary | ICD-10-CM

## 2021-09-09 DIAGNOSIS — L989 Disorder of the skin and subcutaneous tissue, unspecified: Secondary | ICD-10-CM

## 2021-09-09 NOTE — Addendum Note (Signed)
Addended by: Geraldine Contras D on: 09/09/2021 02:29 PM   Modules accepted: Orders

## 2021-09-09 NOTE — Progress Notes (Signed)
   Subjective: 40 y.o. male presenting today as a new patient for evaluation of right forefoot pain is been going on for a few months now. Last visit on 07/15/2021 salicylic acid was applied and he applied it daily without relief. He presents for further treatment   Past Medical History:  Diagnosis Date   History of seizure    Hypertension     Objective: Physical Exam General: The patient is alert and oriented x3 in no acute distress.   Dermatology: Hyperkeratotic skin lesion(s) noted to the plantar aspect of the right foot approximately 1 cm in diameter. Pinpoint bleeding noted upon debridement. Skin is warm, dry and supple bilateral lower extremities. Negative for open lesions or macerations.   Vascular: Palpable pedal pulses bilaterally. No edema or erythema noted. Capillary refill within normal limits.   Neurological: Epicritic and protective threshold grossly intact bilaterally.    Musculoskeletal Exam: Pain on palpation to the noted skin lesion(s).      Assessment: #1 plantar wart right foot    Plan of Care:  #1 Patient was evaluated. #2  Today we discussed the patient's different options and the patient opted to have the wart completely removed and excised today.  The area was prepped aseptically and 3 mL of 2% lidocaine with epi was administered in a local block fashion.  After the area was numbed the plantar verruca was circumscribed using a surgical 11 scalpel and curettage was utilized to remove the wart in its entirety.  It was placed in a sterile specimen container and sent to pathology #3 light dressing applied.  Post care instructions provided.  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 Felecia Shelling, DPM    [address] [place], [place] [address]                Office [phone]  Fax [phone]

## 2021-09-23 ENCOUNTER — Ambulatory Visit: Payer: BC Managed Care – PPO | Admitting: Podiatry

## 2021-10-05 ENCOUNTER — Ambulatory Visit: Payer: BC Managed Care – PPO

## 2022-02-10 NOTE — Progress Notes (Unsigned)
[facility]  Telephone:(336) [phone] Fax:(336) [phone]  ID: Francis Morrison OB: [birth year]  MR#: [id number]  UVO#:[id number]  Patient Care Team: Jerl Mina, MD as PCP - General (Family Medicine)  CHIEF COMPLAINT: Homozygous for hereditary hemachromatosis.  INTERVAL HISTORY: Patient returns to clinic today for routine yearly evaluation and continuation of phlebotomy.  He states he always feels lethargic 1 to 2 weeks prior to his scheduled phlebotomy, but this significantly improves afterwards.  He continues to be active and work full-time.  He has no neurologic complaints. He denies any recent fevers or illnesses. He has a good appetite and denies weight loss.  He denies any chest pain, shortness of breath, cough, or hemoptysis.  He has no abdominal pain and denies any nausea, vomiting, constipation, or diarrhea. He has no urinary complaints.  Patient offers no further specific complaints today.  REVIEW OF SYSTEMS:   Review of Systems  Constitutional:  Positive for malaise/fatigue. Negative for fever and weight loss.  Respiratory: Negative.  Negative for cough and shortness of breath.   Cardiovascular: Negative.  Negative for chest pain and leg swelling.  Gastrointestinal: Negative.  Negative for abdominal pain, blood in stool and melena.  Genitourinary: Negative.  Negative for hematuria.  Musculoskeletal: Negative.  Negative for myalgias.  Skin: Negative.  Negative for rash.  Neurological: Negative.  Negative for dizziness, focal weakness, weakness and headaches.  Psychiatric/Behavioral: Negative.  The patient is not nervous/anxious.    As per HPI. Otherwise, a complete review of systems is negative.  PAST MEDICAL HISTORY: Past Medical History:  Diagnosis Date   History of seizure    Hypertension     PAST SURGICAL HISTORY: No past surgical history on file.  FAMILY HISTORY: Reviewed and unchanged. No reported history of malignancy or chronic disease. No  known history of hemochromatosis.     ADVANCED DIRECTIVES:    HEALTH MAINTENANCE: Social History   Tobacco Use   Smoking status: Former   Smokeless tobacco: Never  Substance Use Topics   Alcohol use: Yes    Comment: occasional   Drug use: No     Colonoscopy:  PAP:  Bone density:  Lipid panel:  No Known Allergies  Current Outpatient Medications  Medication Sig Dispense Refill   cetirizine (ZYRTEC) 10 MG tablet Take 10 mg by mouth daily.     lisinopril (ZESTRIL) 10 MG tablet Take 10 mg by mouth daily. (Patient not taking: Reported on 02/10/2021)     Multiple Vitamins-Minerals (MULTIVITAMIN ADULT PO) Take 1 tablet by mouth 1 day or 1 dose.     No current facility-administered medications for this visit.    OBJECTIVE: There were no vitals filed for this visit.    There is no height or weight on file to calculate BMI.    ECOG FS:0 - Asymptomatic  General: Well-developed, well-nourished, no acute distress. Eyes: Pink conjunctiva, anicteric sclera. HEENT: Normocephalic, moist mucous membranes. Lungs: No audible wheezing or coughing. Heart: Regular rate and rhythm. Abdomen: Soft, nontender, no obvious distention. Musculoskeletal: No edema, cyanosis, or clubbing. Neuro: Alert, answering all questions appropriately. Cranial nerves grossly intact. Skin: No rashes or petechiae noted. Psych: Normal affect.   LAB RESULTS:  Lab Results  Component Value Date   NA 139 03/10/2021   K 4.9 03/10/2021   CL 100 03/10/2021   CO2 24 03/10/2021   GLUCOSE 121 (H) 03/10/2021   BUN 17 03/10/2021   CREATININE 1.09 03/10/2021   CALCIUM 9.5 03/10/2021   PROT 7.5 03/10/2021  ALBUMIN 4.8 03/10/2021   AST 25 03/10/2021   ALT 22 03/10/2021   ALKPHOS 69 03/10/2021   BILITOT 0.6 03/10/2021   GFRNONAA 79 08/20/2018   GFRAA 91 08/20/2018    Lab Results  Component Value Date   WBC 7.5 02/10/2021   NEUTROABS 4.8 02/10/2021   HGB 14.7 02/10/2021   HCT 42.4 02/10/2021   MCV 89.3  02/10/2021   PLT 199 02/10/2021   Lab Results  Component Value Date   FERRITIN 19 (L) 02/10/2021     STUDIES: No results found.  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  PLAN:    1. Hereditary hemachromatosis: Patient's goal ferritin is 50-100.  Although patient's ferritin is decreased at 19 and well below his goal, he wishes to proceed with 500 mL phlebotomy anyway stating he always feels better after treatment.  He was given an order to have his phlebotomy done with [facility].  Return to clinic in 1 year for further evaluation and continuation of treatment.   I spent a total of 20 minutes reviewing chart data, face-to-face evaluation with the patient, counseling and coordination of care as detailed above.   Patient expressed understanding and was in agreement with this plan. He also understands that He can call clinic at any time with any questions, concerns, or complaints.    Jeralyn Ruths, MD   02/10/2022 8:57 AM

## 2022-02-14 ENCOUNTER — Other Ambulatory Visit: Payer: Self-pay

## 2022-02-14 ENCOUNTER — Encounter: Payer: Self-pay | Admitting: Oncology

## 2022-02-14 ENCOUNTER — Inpatient Hospital Stay: Payer: BC Managed Care – PPO | Attending: Oncology

## 2022-02-14 ENCOUNTER — Inpatient Hospital Stay (HOSPITAL_BASED_OUTPATIENT_CLINIC_OR_DEPARTMENT_OTHER): Payer: BC Managed Care – PPO | Admitting: Oncology

## 2022-02-14 ENCOUNTER — Inpatient Hospital Stay: Payer: BC Managed Care – PPO

## 2022-02-14 LAB — CBC WITH DIFFERENTIAL/PLATELET
Abs Immature Granulocytes: 0.01 10*3/uL (ref 0.00–0.07)
Basophils Absolute: 0 10*3/uL (ref 0.0–0.1)
Basophils Relative: 1 %
Eosinophils Absolute: 0.1 10*3/uL (ref 0.0–0.5)
Eosinophils Relative: 1 %
HCT: 43.7 % (ref 39.0–52.0)
Hemoglobin: 14.9 g/dL (ref 13.0–17.0)
Immature Granulocytes: 0 %
Lymphocytes Relative: 29 %
Lymphs Abs: 2 10*3/uL (ref 0.7–4.0)
MCH: 29.9 pg (ref 26.0–34.0)
MCHC: 34.1 g/dL (ref 30.0–36.0)
MCV: 87.8 fL (ref 80.0–100.0)
Monocytes Absolute: 0.5 10*3/uL (ref 0.1–1.0)
Monocytes Relative: 7 %
Neutro Abs: 4.4 10*3/uL (ref 1.7–7.7)
Neutrophils Relative %: 62 %
Platelets: 221 10*3/uL (ref 150–400)
RBC: 4.98 MIL/uL (ref 4.22–5.81)
RDW: 13 % (ref 11.5–15.5)
WBC: 7 10*3/uL (ref 4.0–10.5)
nRBC: 0 % (ref 0.0–0.2)

## 2022-02-14 LAB — FERRITIN: Ferritin: 10 ng/mL — ABNORMAL LOW (ref 24–336)

## 2022-02-14 NOTE — Patient Instructions (Signed)

## 2022-02-14 NOTE — Progress Notes (Signed)
Pt ok to proceed with phlebotomy, per Dr. Orlie Dakin. Pt tolerated procedure well. Vital signs WNL upon discharge.  ?

## 2022-02-15 ENCOUNTER — Encounter: Payer: Self-pay | Admitting: Oncology

## 2022-03-08 ENCOUNTER — Other Ambulatory Visit: Payer: BC Managed Care – PPO

## 2022-03-08 DIAGNOSIS — Z0000 Encounter for general adult medical examination without abnormal findings: Secondary | ICD-10-CM

## 2022-03-08 NOTE — Addendum Note (Signed)
Addended by: Asher Muir A on: 03/08/2022 09:45 AM ? ? Modules accepted: Orders ? ?

## 2022-03-08 NOTE — Addendum Note (Signed)
Addended by: Asher Muir A on: 03/08/2022 10:10 AM ? ? Modules accepted: Orders ? ?

## 2022-03-09 LAB — URINALYSIS, ROUTINE W REFLEX MICROSCOPIC
Bilirubin, UA: NEGATIVE
Glucose, UA: NEGATIVE
Ketones, UA: NEGATIVE
Leukocytes,UA: NEGATIVE
Nitrite, UA: NEGATIVE
Protein,UA: NEGATIVE
RBC, UA: NEGATIVE
Specific Gravity, UA: 1.01 (ref 1.005–1.030)
Urobilinogen, Ur: 0.2 mg/dL (ref 0.2–1.0)
pH, UA: 7 (ref 5.0–7.5)

## 2022-03-14 LAB — TESTOSTERONE,FREE AND TOTAL
Testosterone, Free: 13.8 pg/mL (ref 6.8–21.5)
Testosterone: 563 ng/dL (ref 264–916)

## 2022-03-14 LAB — CBC WITH DIFFERENTIAL/PLATELET
Basophils Absolute: 0 10*3/uL (ref 0.0–0.2)
Basos: 0 %
EOS (ABSOLUTE): 0.2 10*3/uL (ref 0.0–0.4)
Eos: 3 %
Hematocrit: 42.3 % (ref 37.5–51.0)
Hemoglobin: 13.5 g/dL (ref 13.0–17.7)
Immature Grans (Abs): 0 10*3/uL (ref 0.0–0.1)
Immature Granulocytes: 0 %
Lymphocytes Absolute: 1.8 10*3/uL (ref 0.7–3.1)
Lymphs: 34 %
MCH: 28.1 pg (ref 26.6–33.0)
MCHC: 31.9 g/dL (ref 31.5–35.7)
MCV: 88 fL (ref 79–97)
Monocytes Absolute: 0.4 10*3/uL (ref 0.1–0.9)
Monocytes: 7 %
Neutrophils Absolute: 3 10*3/uL (ref 1.4–7.0)
Neutrophils: 56 %
Platelets: 274 10*3/uL (ref 150–450)
RBC: 4.8 x10E6/uL (ref 4.14–5.80)
RDW: 12.8 % (ref 11.6–15.4)
WBC: 5.4 10*3/uL (ref 3.4–10.8)

## 2022-03-14 LAB — COMPREHENSIVE METABOLIC PANEL
ALT: 29 IU/L (ref 0–44)
AST: 30 IU/L (ref 0–40)
Albumin/Globulin Ratio: 2.6 — ABNORMAL HIGH (ref 1.2–2.2)
Albumin: 4.9 g/dL (ref 4.0–5.0)
Alkaline Phosphatase: 76 IU/L (ref 44–121)
BUN/Creatinine Ratio: 13 (ref 9–20)
BUN: 15 mg/dL (ref 6–24)
Bilirubin Total: 0.6 mg/dL (ref 0.0–1.2)
CO2: 21 mmol/L (ref 20–29)
Calcium: 9.9 mg/dL (ref 8.7–10.2)
Chloride: 103 mmol/L (ref 96–106)
Creatinine, Ser: 1.14 mg/dL (ref 0.76–1.27)
Globulin, Total: 1.9 g/dL (ref 1.5–4.5)
Glucose: 126 mg/dL — ABNORMAL HIGH (ref 70–99)
Potassium: 4.5 mmol/L (ref 3.5–5.2)
Sodium: 143 mmol/L (ref 134–144)
Total Protein: 6.8 g/dL (ref 6.0–8.5)
eGFR: 82 mL/min/{1.73_m2} (ref >59)

## 2022-03-14 LAB — LIPID PANEL
Chol/HDL Ratio: 4 ratio (ref 0.0–5.0)
Cholesterol, Total: 202 mg/dL — ABNORMAL HIGH (ref 100–199)
HDL: 50 mg/dL (ref >39)
LDL Chol Calc (NIH): 134 mg/dL — ABNORMAL HIGH (ref 0–99)
Triglycerides: 100 mg/dL (ref 0–149)
VLDL Cholesterol Cal: 18 mg/dL (ref 5–40)

## 2022-03-14 LAB — FERRITIN: Ferritin: 17 ng/mL — ABNORMAL LOW (ref 30–400)

## 2022-03-14 LAB — PSA: Prostate Specific Ag, Serum: 0.7 ng/mL (ref 0.0–4.0)

## 2022-08-09 ENCOUNTER — Other Ambulatory Visit: Payer: Self-pay

## 2022-08-09 DIAGNOSIS — R5383 Other fatigue: Secondary | ICD-10-CM

## 2022-08-15 LAB — CBC WITH DIFFERENTIAL/PLATELET
Basophils Absolute: 0 10*3/uL (ref 0.0–0.2)
Basos: 1 %
EOS (ABSOLUTE): 0.1 10*3/uL (ref 0.0–0.4)
Eos: 2 %
Hematocrit: 43.1 % (ref 37.5–51.0)
Hemoglobin: 14.1 g/dL (ref 13.0–17.7)
Immature Grans (Abs): 0 10*3/uL (ref 0.0–0.1)
Immature Granulocytes: 0 %
Lymphocytes Absolute: 2.3 10*3/uL (ref 0.7–3.1)
Lymphs: 37 %
MCH: 28.1 pg (ref 26.6–33.0)
MCHC: 32.7 g/dL (ref 31.5–35.7)
MCV: 86 fL (ref 79–97)
Monocytes Absolute: 0.5 10*3/uL (ref 0.1–0.9)
Monocytes: 8 %
Neutrophils Absolute: 3.4 10*3/uL (ref 1.4–7.0)
Neutrophils: 52 %
Platelets: 225 10*3/uL (ref 150–450)
RBC: 5.01 x10E6/uL (ref 4.14–5.80)
RDW: 14.6 % (ref 11.6–15.4)
WBC: 6.4 10*3/uL (ref 3.4–10.8)

## 2022-08-15 LAB — COMPREHENSIVE METABOLIC PANEL
ALT: 30 IU/L (ref 0–44)
AST: 30 IU/L (ref 0–40)
Albumin/Globulin Ratio: 2.4 — ABNORMAL HIGH (ref 1.2–2.2)
Albumin: 5.1 g/dL (ref 4.1–5.1)
Alkaline Phosphatase: 95 IU/L (ref 44–121)
BUN/Creatinine Ratio: 18 (ref 9–20)
BUN: 21 mg/dL (ref 6–24)
Bilirubin Total: 0.7 mg/dL (ref 0.0–1.2)
CO2: 22 mmol/L (ref 20–29)
Calcium: 9.6 mg/dL (ref 8.7–10.2)
Chloride: 100 mmol/L (ref 96–106)
Creatinine, Ser: 1.2 mg/dL (ref 0.76–1.27)
Globulin, Total: 2.1 g/dL (ref 1.5–4.5)
Glucose: 116 mg/dL — ABNORMAL HIGH (ref 70–99)
Potassium: 4.2 mmol/L (ref 3.5–5.2)
Sodium: 138 mmol/L (ref 134–144)
Total Protein: 7.2 g/dL (ref 6.0–8.5)
eGFR: 77 mL/min/{1.73_m2} (ref >59)

## 2022-08-15 LAB — FERRITIN: Ferritin: 27 ng/mL — ABNORMAL LOW (ref 30–400)

## 2022-08-15 LAB — LIPID PANEL
Chol/HDL Ratio: 4.8 ratio (ref 0.0–5.0)
Cholesterol, Total: 271 mg/dL — ABNORMAL HIGH (ref 100–199)
HDL: 56 mg/dL (ref >39)
LDL Chol Calc (NIH): 182 mg/dL — ABNORMAL HIGH (ref 0–99)
Triglycerides: 176 mg/dL — ABNORMAL HIGH (ref 0–149)
VLDL Cholesterol Cal: 33 mg/dL (ref 5–40)

## 2022-08-15 LAB — TESTOSTERONE,FREE AND TOTAL
Testosterone, Free: 13.4 pg/mL (ref 6.8–21.5)
Testosterone: 323 ng/dL (ref 264–916)

## 2023-02-06 ENCOUNTER — Other Ambulatory Visit: Payer: Self-pay

## 2023-02-06 NOTE — Addendum Note (Signed)
Addended by: Leana Gamer on: 02/06/2023 09:57 AM   Modules accepted: Orders

## 2023-02-09 LAB — CBC WITH DIFFERENTIAL/PLATELET
Basophils Absolute: 0 10*3/uL (ref 0.0–0.2)
Basos: 1 %
EOS (ABSOLUTE): 0.2 10*3/uL (ref 0.0–0.4)
Eos: 3 %
Hematocrit: 42.5 % (ref 37.5–51.0)
Hemoglobin: 13.7 g/dL (ref 13.0–17.7)
Immature Grans (Abs): 0 10*3/uL (ref 0.0–0.1)
Immature Granulocytes: 0 %
Lymphocytes Absolute: 2.2 10*3/uL (ref 0.7–3.1)
Lymphs: 32 %
MCH: 27.3 pg (ref 26.6–33.0)
MCHC: 32.2 g/dL (ref 31.5–35.7)
MCV: 85 fL (ref 79–97)
Monocytes Absolute: 0.6 10*3/uL (ref 0.1–0.9)
Monocytes: 8 %
Neutrophils Absolute: 3.9 10*3/uL (ref 1.4–7.0)
Neutrophils: 56 %
Platelets: 238 10*3/uL (ref 150–450)
RBC: 5.01 x10E6/uL (ref 4.14–5.80)
RDW: 14.6 % (ref 11.6–15.4)
WBC: 6.9 10*3/uL (ref 3.4–10.8)

## 2023-02-09 LAB — TSH: TSH: 5.16 u[IU]/mL — ABNORMAL HIGH (ref 0.450–4.500)

## 2023-02-09 LAB — VITAMIN B12: Vitamin B-12: 747 pg/mL (ref 232–1245)

## 2023-02-09 LAB — TESTOSTERONE,FREE AND TOTAL
Testosterone, Free: 22.6 pg/mL — ABNORMAL HIGH (ref 6.8–21.5)
Testosterone: 597 ng/dL (ref 264–916)

## 2023-02-15 ENCOUNTER — Inpatient Hospital Stay: Payer: BC Managed Care – PPO | Admitting: Oncology

## 2023-02-15 ENCOUNTER — Inpatient Hospital Stay: Payer: BC Managed Care – PPO

## 2024-04-14 ENCOUNTER — Telehealth: Payer: Self-pay | Admitting: Oncology

## 2024-04-14 ENCOUNTER — Other Ambulatory Visit: Payer: Self-pay | Admitting: *Deleted

## 2024-04-14 NOTE — Telephone Encounter (Signed)
 Patient called to request appointment- he was last seen 02/15/23. Please advise,   [phone]

## 2024-04-18 ENCOUNTER — Inpatient Hospital Stay: Attending: Oncology

## 2024-04-18 DIAGNOSIS — Z87891 Personal history of nicotine dependence: Secondary | ICD-10-CM | POA: Diagnosis not present

## 2024-04-18 DIAGNOSIS — Z79899 Other long term (current) drug therapy: Secondary | ICD-10-CM | POA: Diagnosis not present

## 2024-04-18 DIAGNOSIS — I10 Essential (primary) hypertension: Secondary | ICD-10-CM | POA: Diagnosis not present

## 2024-04-18 LAB — CBC WITH DIFFERENTIAL/PLATELET
Abs Immature Granulocytes: 0.05 10*3/uL (ref 0.00–0.07)
Basophils Absolute: 0.1 10*3/uL (ref 0.0–0.1)
Basophils Relative: 1 %
Eosinophils Absolute: 0.1 10*3/uL (ref 0.0–0.5)
Eosinophils Relative: 1 %
HCT: 43.4 % (ref 39.0–52.0)
Hemoglobin: 14.2 g/dL (ref 13.0–17.0)
Immature Granulocytes: 1 %
Lymphocytes Relative: 27 %
Lymphs Abs: 2.5 10*3/uL (ref 0.7–4.0)
MCH: 26.6 pg (ref 26.0–34.0)
MCHC: 32.7 g/dL (ref 30.0–36.0)
MCV: 81.4 fL (ref 80.0–100.0)
Monocytes Absolute: 0.7 10*3/uL (ref 0.1–1.0)
Monocytes Relative: 7 %
Neutro Abs: 6.1 10*3/uL (ref 1.7–7.7)
Neutrophils Relative %: 63 %
Platelets: 258 10*3/uL (ref 150–400)
RBC: 5.33 MIL/uL (ref 4.22–5.81)
RDW: 14.6 % (ref 11.5–15.5)
WBC: 9.6 10*3/uL (ref 4.0–10.5)
nRBC: 0 % (ref 0.0–0.2)

## 2024-04-18 LAB — IRON AND TIBC
Iron: 39 ug/dL — ABNORMAL LOW (ref 45–182)
Saturation Ratios: 7 % — ABNORMAL LOW (ref 17.9–39.5)
TIBC: 575 ug/dL — ABNORMAL HIGH (ref 250–450)
UIBC: 536 ug/dL

## 2024-04-18 LAB — FERRITIN: Ferritin: 8 ng/mL — ABNORMAL LOW (ref 24–336)

## 2024-04-21 ENCOUNTER — Encounter: Payer: Self-pay | Admitting: Oncology

## 2024-04-21 ENCOUNTER — Inpatient Hospital Stay: Admitting: Oncology

## 2024-04-21 ENCOUNTER — Inpatient Hospital Stay

## 2024-04-21 NOTE — Progress Notes (Signed)
No phlebotomy today.

## 2024-04-21 NOTE — Progress Notes (Signed)
 [facility]  [phone] [phone]  ID: Francis Morrison OB: November 21, 1981  MR#: [id number]  AOZ#:[id number]  Patient Care Team: Lyle San, MD as PCP - General (Family Medicine) Shellie Dials, MD as Consulting Physician (Oncology)  CHIEF COMPLAINT: Homozygous for hereditary hemachromatosis.  INTERVAL HISTORY: Patient last seen in clinic on February 14, 2022.  He returns today for repeat laboratory work and further evaluation.  He has noticed increasing fatigue over the past 4 to 6 weeks, but otherwise has felt well.  He has not had phlebotomy in several years. He has no neurologic complaints. He denies any recent fevers or illnesses. He has a good appetite and denies weight loss.  He denies any chest pain, shortness of breath, cough, or hemoptysis.  He has no abdominal pain and denies any nausea, vomiting, constipation, or diarrhea.  He denies any melena or hematochezia.  He has no urinary complaints.  Patient offers no further specific complaints today.  REVIEW OF SYSTEMS:   Review of Systems  Constitutional:  Positive for malaise/fatigue. Negative for fever and weight loss.  Respiratory: Negative.  Negative for cough and shortness of breath.   Cardiovascular: Negative.  Negative for chest pain and leg swelling.  Gastrointestinal: Negative.  Negative for abdominal pain, blood in stool and melena.  Genitourinary: Negative.  Negative for hematuria.  Musculoskeletal: Negative.  Negative for myalgias.  Skin: Negative.  Negative for rash.  Neurological: Negative.  Negative for dizziness, focal weakness, weakness and headaches.  Psychiatric/Behavioral: Negative.  The patient is not nervous/anxious.     As per HPI. Otherwise, a complete review of systems is negative.  PAST MEDICAL HISTORY: Past Medical History:  Diagnosis Date   History of seizure    Hypertension     PAST SURGICAL HISTORY: History reviewed. No pertinent surgical  history.  FAMILY HISTORY: Reviewed and unchanged. No reported history of malignancy or chronic disease. No known history of hemochromatosis.     ADVANCED DIRECTIVES:    HEALTH MAINTENANCE: Social History   Tobacco Use   Smoking status: Former   Smokeless tobacco: Never  Substance Use Topics   Alcohol use: Yes    Comment: occasional   Drug use: No     Colonoscopy:  PAP:  Bone density:  Lipid panel:  No Known Allergies  Current Outpatient Medications  Medication Sig Dispense Refill   cetirizine (ZYRTEC) 10 MG tablet Take 10 mg by mouth daily.     Multiple Vitamins-Minerals (MULTIVITAMIN ADULT PO) Take 1 tablet by mouth 1 day or 1 dose.     lisinopril (ZESTRIL) 10 MG tablet Take 10 mg by mouth daily. (Patient not taking: Reported on 04/21/2024)     No current facility-administered medications for this visit.    OBJECTIVE: Vitals:   04/21/24 1321  BP: (!) 160/98  Pulse: 92  Resp: 16  Temp: (!) 97.1 F (36.2 C)  SpO2: 100%      Body mass index is 26.85 kg/m.    ECOG FS:0 - Asymptomatic  General: Well-developed, well-nourished, no acute distress. Eyes: Pink conjunctiva, anicteric sclera. HEENT: Normocephalic, moist mucous membranes. Lungs: No audible wheezing or coughing. Heart: Regular rate and rhythm. Abdomen: Soft, nontender, no obvious distention. Musculoskeletal: No edema, cyanosis, or clubbing. Neuro: Alert, answering all questions appropriately. Cranial nerves grossly intact. Skin: No rashes or petechiae noted. Psych: Normal affect.  LAB RESULTS:  Lab Results  Component Value Date   NA 138 08/09/2022   K 4.2 08/09/2022   CL 100 08/09/2022  CO2 22 08/09/2022   GLUCOSE 116 (H) 08/09/2022   BUN 21 08/09/2022   CREATININE 1.20 08/09/2022   CALCIUM 9.6 08/09/2022   PROT 7.2 08/09/2022   ALBUMIN 5.1 08/09/2022   AST 30 08/09/2022   ALT 30 08/09/2022   ALKPHOS 95 08/09/2022   BILITOT 0.7 08/09/2022   GFRNONAA 79 08/20/2018   GFRAA 91 08/20/2018     Lab Results  Component Value Date   WBC 9.6 04/18/2024   NEUTROABS 6.1 04/18/2024   HGB 14.2 04/18/2024   HCT 43.4 04/18/2024   MCV 81.4 04/18/2024   PLT 258 04/18/2024   Lab Results  Component Value Date   FERRITIN 8 (L) 04/18/2024   Lab Results  Component Value Date   IRON 39 (L) 04/18/2024   TIBC 575 (H) 04/18/2024   IRONPCTSAT 7 (L) 04/18/2024     STUDIES: No results found.  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  PLAN:    Hereditary hemachromatosis: Patient's hemoglobin continues to be within normal limits, but now he has iron deficiency.  He reports no phlebotomy since his last clinic visit in March 2023.  No intervention is needed.  Return to clinic in 6 weeks with repeat laboratory work and video-assisted telemedicine visit. Iron deficiency: Despite having hemochromatosis, patient may benefit from iron infusions in the future, but will hold off any treatment at this time.  Have also recommended patient will likely require a screening colonoscopy in the near future. Hypertension: Patient's blood pressure is moderately elevated today.  Continue monitoring and treatment per primary care.   Patient expressed understanding and was in agreement with this plan. He also understands that He can call clinic at any time with any questions, concerns, or complaints.    Shellie Dials, MD   04/21/2024 3:17 PM

## 2024-05-30 ENCOUNTER — Inpatient Hospital Stay

## 2024-05-30 ENCOUNTER — Inpatient Hospital Stay: Attending: Oncology

## 2024-05-30 DIAGNOSIS — I10 Essential (primary) hypertension: Secondary | ICD-10-CM | POA: Diagnosis not present

## 2024-05-30 LAB — CBC WITH DIFFERENTIAL/PLATELET
Abs Immature Granulocytes: 0.01 10*3/uL (ref 0.00–0.07)
Basophils Absolute: 0 10*3/uL (ref 0.0–0.1)
Basophils Relative: 1 %
Eosinophils Absolute: 0.1 10*3/uL (ref 0.0–0.5)
Eosinophils Relative: 1 %
HCT: 41.3 % (ref 39.0–52.0)
Hemoglobin: 13.5 g/dL (ref 13.0–17.0)
Immature Granulocytes: 0 %
Lymphocytes Relative: 42 %
Lymphs Abs: 2.3 10*3/uL (ref 0.7–4.0)
MCH: 27.2 pg (ref 26.0–34.0)
MCHC: 32.7 g/dL (ref 30.0–36.0)
MCV: 83.3 fL (ref 80.0–100.0)
Monocytes Absolute: 0.4 10*3/uL (ref 0.1–1.0)
Monocytes Relative: 8 %
Neutro Abs: 2.7 10*3/uL (ref 1.7–7.7)
Neutrophils Relative %: 48 %
Platelets: 234 10*3/uL (ref 150–400)
RBC: 4.96 MIL/uL (ref 4.22–5.81)
RDW: 15.8 % — ABNORMAL HIGH (ref 11.5–15.5)
WBC: 5.6 10*3/uL (ref 4.0–10.5)
nRBC: 0 % (ref 0.0–0.2)

## 2024-05-30 LAB — IRON AND TIBC
Iron: 78 ug/dL (ref 45–182)
Saturation Ratios: 14 % — ABNORMAL LOW (ref 17.9–39.5)
TIBC: 559 ug/dL — ABNORMAL HIGH (ref 250–450)
UIBC: 481 ug/dL

## 2024-05-30 LAB — FERRITIN: Ferritin: 9 ng/mL — ABNORMAL LOW (ref 24–336)

## 2024-06-02 ENCOUNTER — Inpatient Hospital Stay: Admitting: Oncology

## 2024-06-02 NOTE — Progress Notes (Signed)
 [facility]  Telephone:(336) [phone] Fax:(336) [phone]  ID: Francis Morrison OB: 03-10-1981  MR#: [id number]  RDW#:[id number]  Patient Care Team: Valora Agent, MD as PCP - General (Family Medicine) Jacobo Evalene JINNY, MD as Consulting Physician (Oncology)  I connected with Francis Morrison on 06/02/24 at  2:45 PM EDT by video enabled telemedicine visit and verified that I am speaking with the correct person using two identifiers.   I discussed the limitations, risks, security and privacy concerns of performing an evaluation and management service by telemedicine and the availability of in-person appointments. I also discussed with the patient that there may be a patient responsible charge related to this service. The patient expressed understanding and agreed to proceed.   Other persons participating in the visit and their role in the encounter: Patient, MD.  Patient's location: Home. Provider's location: Clinic.  CHIEF COMPLAINT: Homozygous for hereditary hemachromatosis.  INTERVAL HISTORY: Patient agreed to video-assisted telemedicine visit for further evaluation and discussion of his laboratory results.  He does not complain of fatigue today.  He currently feels well. He has no neurologic complaints. He denies any recent fevers or illnesses. He has a good appetite and denies weight loss.  He denies any chest pain, shortness of breath, cough, or hemoptysis.  He has no abdominal pain and denies any nausea, vomiting, constipation, or diarrhea.  He denies any melena or hematochezia.  He has no urinary complaints.  Patient offers no specific complaints today.  REVIEW OF SYSTEMS:   Review of Systems  Constitutional: Negative.  Negative for fever, malaise/fatigue and weight loss.  Respiratory: Negative.  Negative for cough and shortness of breath.   Cardiovascular: Negative.  Negative for chest pain and leg swelling.  Gastrointestinal: Negative.  Negative for abdominal  pain, blood in stool and melena.  Genitourinary: Negative.  Negative for hematuria.  Musculoskeletal: Negative.  Negative for myalgias.  Skin: Negative.  Negative for rash.  Neurological: Negative.  Negative for dizziness, focal weakness, weakness and headaches.  Psychiatric/Behavioral: Negative.  The patient is not nervous/anxious.     As per HPI. Otherwise, a complete review of systems is negative.  PAST MEDICAL HISTORY: Past Medical History:  Diagnosis Date   History of seizure    Hypertension     PAST SURGICAL HISTORY: No past surgical history on file.  FAMILY HISTORY: Reviewed and unchanged. No reported history of malignancy or chronic disease. No known history of hemochromatosis.     ADVANCED DIRECTIVES:    HEALTH MAINTENANCE: Social History   Tobacco Use   Smoking status: Former   Smokeless tobacco: Never  Substance Use Topics   Alcohol use: Yes    Comment: occasional   Drug use: No     Colonoscopy:  PAP:  Bone density:  Lipid panel:  No Known Allergies  Current Outpatient Medications  Medication Sig Dispense Refill   cetirizine (ZYRTEC) 10 MG tablet Take 10 mg by mouth daily.     lisinopril (ZESTRIL) 10 MG tablet Take 10 mg by mouth daily. (Patient not taking: Reported on 04/21/2024)     Multiple Vitamins-Minerals (MULTIVITAMIN ADULT PO) Take 1 tablet by mouth 1 day or 1 dose.     No current facility-administered medications for this visit.    OBJECTIVE: There were no vitals filed for this visit.     There is no height or weight on file to calculate BMI.    ECOG FS:0 - Asymptomatic  General: Well-developed, well-nourished, no acute distress. HEENT: Normocephalic. Neuro: Alert,  answering all questions appropriately. Cranial nerves grossly intact. Psych: Normal affect.  LAB RESULTS:  Lab Results  Component Value Date   NA 138 08/09/2022   K 4.2 08/09/2022   CL 100 08/09/2022   CO2 22 08/09/2022   GLUCOSE 116 (H) 08/09/2022   BUN 21  08/09/2022   CREATININE 1.20 08/09/2022   CALCIUM 9.6 08/09/2022   PROT 7.2 08/09/2022   ALBUMIN 5.1 08/09/2022   AST 30 08/09/2022   ALT 30 08/09/2022   ALKPHOS 95 08/09/2022   BILITOT 0.7 08/09/2022   GFRNONAA 79 08/20/2018   GFRAA 91 08/20/2018    Lab Results  Component Value Date   WBC 5.6 05/30/2024   NEUTROABS 2.7 05/30/2024   HGB 13.5 05/30/2024   HCT 41.3 05/30/2024   MCV 83.3 05/30/2024   PLT 234 05/30/2024   Lab Results  Component Value Date   FERRITIN 9 (L) 05/30/2024   Lab Results  Component Value Date   IRON 78 05/30/2024   TIBC 559 (H) 05/30/2024   IRONPCTSAT 14 (L) 05/30/2024     STUDIES: No results found.  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  PLAN:    Hereditary hemachromatosis: Patient's hemoglobin continues to be within normal limits.  He has a decreased iron saturation ratio and ferritin, but these have trended up.  Total iron is now within normal limits.  Patient has not had phlebotomy since March 2023.  No intervention is needed.  Return to clinic in 6 months with repeat laboratory work and video-assisted telemedicine visit. Iron deficiency: Improving.  Patient continues to have reduced iron stores despite his diagnosis of hemochromatosis.  No intervention is needed.  I referred patient to GI for consideration of screening colonoscopy.   Hypertension: Continue monitoring and treatment per primary care.  I provided 20 minutes of face-to-face video visit time during this encounter which included chart review, counseling, and coordination of care as documented above.  Patient expressed understanding and was in agreement with this plan. He also understands that He can call clinic at any time with any questions, concerns, or complaints.    [name], MD   06/02/2024 3:37 PM

## 2024-09-28 NOTE — Progress Notes (Signed)
 Francis Morrison a 43 y.o.  male and is here for a comprehensive physical exam.  Patient comes in today for physical exam.  Recently got married.  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  Has not had lab work because they did not get the faxed lab order last week.  Has been taking testosterone  from an outside source once a week 1 mL, has good energy level, has not checked his blood pressure outside of here.  No headaches dizziness or blurred vision.  Past Medical History:  Diagnosis Date  . Hypertension     Patient Active Problem List  Diagnosis  . Hypertension  . Hereditary hemochromatosis ()    Prior to Admission medications  Not on File    No Known Allergies  Social History   Tobacco Use  . Smoking status: Former  . Smokeless tobacco: Current    Types: Snuff    Family History  Problem Relation Name Age of Onset  . High blood pressure (Hypertension) Father    . Hyperlipidemia (Elevated cholesterol) Father      Wellness; Patient's immunization status, colon cancer screening status, and discussion of PSA testing occurred at this visit.   Health Habits: Diet: Good Exercise. intermittently  The following portions of the patient's history were reviewed and updated as appropriate: allergies, current medications, past family history, past medical history, past social history, past surgical history and problem list.  Review of Systems Constitutional: negative for anorexia, chills, fatigue, fevers and weight loss   Eyes: negative for icterus, irritation, redness and visual disturbance Ears, nose, mouth, throat, and face: negative for earaches, hearing loss, hoarseness, nasal congestion, sore throat, tinnitus and voice change Respiratory: negative for cough, dyspnea on exertion, pleurisy/chest pain, sputum and wheezing Cardiovascular: negative for chest pain, chest pressure/discomfort, , exertional chest pressure/discomfort, irregular heart  beat, near-syncope, orthopnea, palpitations, paroxysmal nocturnal dyspnea,  tachypnea Gastrointestinal: negative for abdominal pain, change in bowel habits, constipation, diarrhea, dyspepsia, dysphagia, melena, nausea, reflux symptoms and vomiting Genitourinary:negative for genital lesions,  sexual problems, decreased stream, dysuria, frequency, hematuria, hesitancy, nocturia and urinary incontinence Integument:: negative for pruritus, rash, skin color change and skin lesion(s) Hematologic/lymphatic: negative for bleeding, easy bruising, lymphadenopathy and petechiae Musculoskeletal:negative for arthralgias, back pain, bone pain, muscle weakness, myalgias and neck pain Neurological: negative for coordination problems, dizziness, gait problems, headaches, memory problems, paresthesia, speech problems, tremors, vertigo and weakness Behavioral/Psych: negative for anxiety, depression and irritability Endocrine: negative for cold intolerance and heat intolerance Allergic/Immunologic: negative for angioedema and urticaria    Objective:   BP (!) 132/96   Pulse 86   Ht 185.4 cm (6' 1)   Wt 90.3 kg (199 lb)   BMI 26.25 kg/m    General. Well appearing Skin. No suspicious lesions or moles Eyes. Sclera and conjunctiva clear; pupils round and reactive to light and accommodation; extraocular movements intact; view to fundus clear; disc sharp, without hemorrhages or exudates; vessels normal Ears. External normal; canals clear; tympanic membranes normal Nose. Mucosa healthy without drainage or ulceration Oropharynx. No suspicious lesions Neck. No swelling, masses, stiffness, pain, limited movement Lungs. Respirations unlabored; clear to auscultation bilaterally Back. No spinal deformity Cardiovascular. Regular, rate, and rhythm without murmurs, gallops, or rubs Abdomen. Soft; nontender; nondistended; normoactive bowel sounds; no masses or organomegaly Lymph Nodes. No significant cervical or  inguinal lymphadenopathy noted Genitalia.  Deferred Rectal.  Deferred Musculoskeletal. No deformities; no active joint inflammation Extremeties. No edema, cyanosis, clubbing, or varicosities Vessels. Symmetric  bilaterally Neurologic. Alert and oriented; speech intact; face symmetrical; moves all extremities well    Assessment:   Wellness visit, see below.   Plan:   #1: Wellness visit-encouraged healthy diet, regular exercise, regular health maintenance   Diagnoses and all orders for this visit:  Routine adult health maintenance  Depression screening (Z13.31) -     Depression Screen -(PHQ- 2/9, BDI)  Hypertension, unspecified type Blood pressure rechecked at 118/82, continue to monitor Low testosterone  Check testosterone  level with lab work Skin lesions -     Ambulatory Referral to Dermatology for several skin lesions patient is concerned about  Hereditary hemochromatosis () Patient would also like to see a different hematologist in [place] but he would let us  know who that is Other orders -     Follow up in Primary Care; Future     Follow up in 1 year (on 09/30/2025). Follow-up     Future Labs/Procedures Expected by Expires   Follow up in Primary Care [[id number] Custom]  09/30/2025 11/30/2025   Questions:     Does this order need to be coordinated with another visit or should it be hidden from the patient portal? If yes to either, the patient will need to stop by the front desk or call to schedule.: No   Who is this follow-up with?: PCP   What type of follow up is needed?: Physical   What's the reason for follow up?:            The patient or responsible adult showed the ability to learn, asked appropriate questions. There were no barriers to learning and they verbalized understanding of the treatment plan.

## 2024-10-01 ENCOUNTER — Other Ambulatory Visit: Payer: Self-pay

## 2024-10-01 DIAGNOSIS — Z0000 Encounter for general adult medical examination without abnormal findings: Secondary | ICD-10-CM

## 2024-10-01 DIAGNOSIS — Z125 Encounter for screening for malignant neoplasm of prostate: Secondary | ICD-10-CM

## 2024-10-02 ENCOUNTER — Other Ambulatory Visit: Payer: Self-pay

## 2024-10-02 DIAGNOSIS — Z125 Encounter for screening for malignant neoplasm of prostate: Secondary | ICD-10-CM

## 2024-10-02 DIAGNOSIS — Z0000 Encounter for general adult medical examination without abnormal findings: Secondary | ICD-10-CM

## 2024-10-03 LAB — URINALYSIS
Bilirubin, UA: NEGATIVE
Glucose, UA: NEGATIVE
Ketones, UA: NEGATIVE
Leukocytes,UA: NEGATIVE
Nitrite, UA: NEGATIVE
Protein,UA: NEGATIVE
RBC, UA: NEGATIVE
Specific Gravity, UA: <=1.005 — AB (ref 1.005–1.030)
Urobilinogen, Ur: 0.2 mg/dL (ref 0.2–1.0)
pH, UA: 8 — ABNORMAL HIGH (ref 5.0–7.5)

## 2024-10-03 LAB — TESTOSTERONE: Testosterone: 832 ng/dL (ref 264–916)

## 2024-10-03 LAB — COMPREHENSIVE METABOLIC PANEL WITH GFR
ALT: 40 IU/L (ref 0–44)
AST: 34 IU/L (ref 0–40)
Albumin: 4.7 g/dL (ref 4.1–5.1)
Alkaline Phosphatase: 76 IU/L (ref 47–123)
BUN/Creatinine Ratio: 10 (ref 9–20)
BUN: 11 mg/dL (ref 6–24)
Bilirubin Total: 0.8 mg/dL (ref 0.0–1.2)
CO2: 25 mmol/L (ref 20–29)
Calcium: 9.4 mg/dL (ref 8.7–10.2)
Chloride: 100 mmol/L (ref 96–106)
Creatinine, Ser: 1.14 mg/dL (ref 0.76–1.27)
Globulin, Total: 2.4 g/dL (ref 1.5–4.5)
Glucose: 127 mg/dL — ABNORMAL HIGH (ref 70–99)
Potassium: 4.5 mmol/L (ref 3.5–5.2)
Sodium: 139 mmol/L (ref 134–144)
Total Protein: 7.1 g/dL (ref 6.0–8.5)
eGFR: 81 mL/min/1.73 (ref >59)

## 2024-10-03 LAB — CBC
Hematocrit: 49.6 % (ref 37.5–51.0)
Hemoglobin: 15.8 g/dL (ref 13.0–17.7)
MCH: 28.1 pg (ref 26.6–33.0)
MCHC: 31.9 g/dL (ref 31.5–35.7)
MCV: 88 fL (ref 79–97)
Platelets: 256 x10E3/uL (ref 150–450)
RBC: 5.63 x10E6/uL (ref 4.14–5.80)
RDW: 14.1 % (ref 11.6–15.4)
WBC: 7.4 x10E3/uL (ref 3.4–10.8)

## 2024-10-03 LAB — LIPID PANEL
Chol/HDL Ratio: 5.3 ratio — ABNORMAL HIGH (ref 0.0–5.0)
Cholesterol, Total: 239 mg/dL — ABNORMAL HIGH (ref 100–199)
HDL: 45 mg/dL (ref >39)
LDL Chol Calc (NIH): 172 mg/dL — ABNORMAL HIGH (ref 0–99)
Triglycerides: 124 mg/dL (ref 0–149)
VLDL Cholesterol Cal: 22 mg/dL (ref 5–40)

## 2024-10-03 LAB — FERRITIN: Ferritin: 22 ng/mL — ABNORMAL LOW (ref 30–400)

## 2024-10-03 LAB — PSA: Prostate Specific Ag, Serum: 0.9 ng/mL (ref 0.0–4.0)

## 2024-11-24 ENCOUNTER — Inpatient Hospital Stay: Attending: Family Medicine

## 2024-12-01 ENCOUNTER — Inpatient Hospital Stay: Admitting: Oncology
